# Patient Record
Sex: Male | Born: 1975
Health system: Southern US, Community
[De-identification: ages and names within clinical notes are randomized; demographics above are authoritative.]

## PROBLEM LIST (undated history)

## (undated) DIAGNOSIS — I10 Essential (primary) hypertension: Secondary | ICD-10-CM

## (undated) HISTORY — PX: STAPEDECTOMY: SHX2435

## (undated) HISTORY — DX: Essential (primary) hypertension: I10

---

## 2009-10-11 DIAGNOSIS — H526 Other disorders of refraction: Secondary | ICD-10-CM | POA: Insufficient documentation

## 2017-03-14 ENCOUNTER — Ambulatory Visit: Payer: Self-pay | Admitting: Physician Assistant

## 2017-04-08 ENCOUNTER — Ambulatory Visit: Payer: Self-pay | Admitting: Internal Medicine

## 2017-10-21 ENCOUNTER — Encounter (INDEPENDENT_AMBULATORY_CARE_PROVIDER_SITE_OTHER): Payer: Self-pay

## 2017-10-28 ENCOUNTER — Encounter: Payer: Self-pay | Admitting: Family Medicine

## 2017-10-28 ENCOUNTER — Ambulatory Visit: Payer: BLUE CROSS/BLUE SHIELD | Admitting: Family Medicine

## 2017-10-28 VITALS — BP 130/98 | HR 69 | Temp 98.3°F | Ht 62.5 in | Wt 180.0 lb

## 2017-10-28 DIAGNOSIS — I10 Essential (primary) hypertension: Secondary | ICD-10-CM

## 2017-10-28 LAB — BASIC METABOLIC PANEL
BUN: 11 mg/dL (ref 6–23)
CHLORIDE: 101 meq/L (ref 96–112)
CO2: 30 mEq/L (ref 19–32)
Calcium: 10 mg/dL (ref 8.4–10.5)
Creatinine, Ser: 0.99 mg/dL (ref 0.40–1.50)
GFR: 88.4 mL/min (ref >60.00)
Glucose, Bld: 106 mg/dL — ABNORMAL HIGH (ref 70–99)
Potassium: 3.6 mEq/L (ref 3.5–5.1)
SODIUM: 138 meq/L (ref 135–145)

## 2017-10-28 LAB — HEPATIC FUNCTION PANEL
ALT: 55 U/L — AB (ref 0–53)
AST: 25 U/L (ref 0–37)
Albumin: 4.8 g/dL (ref 3.5–5.2)
Alkaline Phosphatase: 53 U/L (ref 39–117)
BILIRUBIN DIRECT: 0.1 mg/dL (ref 0.0–0.3)
BILIRUBIN TOTAL: 0.8 mg/dL (ref 0.2–1.2)
Total Protein: 7.7 g/dL (ref 6.0–8.3)

## 2017-10-28 LAB — LDL CHOLESTEROL, DIRECT: LDL DIRECT: 104 mg/dL

## 2017-10-28 LAB — LIPID PANEL
CHOL/HDL RATIO: 4
CHOLESTEROL: 161 mg/dL (ref 0–200)
HDL: 36.1 mg/dL — ABNORMAL LOW (ref >39.00)
NONHDL: 124.43
Triglycerides: 295 mg/dL — ABNORMAL HIGH (ref 0.0–149.0)
VLDL: 59 mg/dL — AB (ref 0.0–40.0)

## 2017-10-28 MED ORDER — IRBESARTAN 300 MG PO TABS: 300.0000 mg | ORAL_TABLET | Freq: Every day | ORAL | 1 refills | Status: DC

## 2017-10-28 NOTE — Progress Notes (Signed)
   Subjective:    Patient ID: Alan Collier, male    DOB: December 23, 1975, 42 y.o.   MRN: [id number]  HPI   Patient presents to clinic to establish primary care. He recently moved to area from the state of [place] to be near his family.  He has taken avapro 300mg  for BP control for years, has been off it for a few weeks due to not having insurance or PCP in area.    Patient Active Problem List   Diagnosis Date Noted  . Essential hypertension 10/28/2017   Social History   Tobacco Use  . Smoking status: Never Smoker  . Smokeless tobacco: Never Used  Substance Use Topics  . Alcohol use: Not on file   Family History  Problem Relation Age of Onset  . Hypertension Mother   . Hypertension Maternal Grandmother   . Diabetes Maternal Grandmother   . Gastric cancer Maternal Grandmother    History reviewed. No pertinent surgical history.   Review of Systems  Constitutional: Negative for chills, fatigue and fever.  HENT: Negative for congestion, ear pain, sinus pain and sore throat.   Eyes: Negative.   Respiratory: Negative for cough, shortness of breath and wheezing.   Cardiovascular: Negative for chest pain, palpitations and leg swelling.  Gastrointestinal: Negative for abdominal pain, diarrhea, nausea and vomiting.  Genitourinary: Negative for dysuria, frequency and urgency.  Musculoskeletal: Negative for arthralgias and myalgias.  Skin: Negative for color change, pallor and rash.  Neurological: Negative for syncope, light-headedness and headaches.  Psychiatric/Behavioral: The patient is not nervous/anxious.       Objective:   Physical Exam  Constitutional: He is oriented to person, place, and time. He appears well-developed and well-nourished. No distress.  HENT:  Head: Normocephalic and atraumatic.  Neck: Neck supple. No tracheal deviation present.  Cardiovascular: Normal rate, regular rhythm and normal heart sounds.  No murmur heard. No carotid bruit  Pulmonary/Chest:  Effort normal and breath sounds normal. No respiratory distress.  Musculoskeletal: Normal range of motion. He exhibits no edema.  Neurological: He is alert and oriented to person, place, and time.  Skin: Skin is warm and dry. No pallor.  Psychiatric: He has a normal mood and affect. His behavior is normal. Thought content normal.  Nursing note and vitals reviewed.     Vitals:   10/28/17 0953  BP: (!) 130/98  Pulse: 69  Temp: 98.3 F (36.8 C)  SpO2: 99%    Assessment & Plan:    Essential HTN --- Patient will continue avapro 300mg  daily and monitor BP. He will follow good diet and exercise. We will get BMET, Lipid panel and hepatic panel drawn in lab today.  Declines flu vaccine in clinic today  Follow up in 6 months, return to clinic sooner if issues arise.

## 2017-10-29 DIAGNOSIS — H9319 Tinnitus, unspecified ear: Secondary | ICD-10-CM | POA: Diagnosis not present

## 2017-10-29 DIAGNOSIS — H90A32 Mixed conductive and sensorineural hearing loss, unilateral, left ear with restricted hearing on the contralateral side: Secondary | ICD-10-CM | POA: Diagnosis not present

## 2017-10-30 ENCOUNTER — Encounter: Payer: Self-pay | Admitting: *Deleted

## 2017-12-02 ENCOUNTER — Other Ambulatory Visit: Payer: Self-pay | Admitting: Otolaryngology

## 2017-12-02 DIAGNOSIS — H90A32 Mixed conductive and sensorineural hearing loss, unilateral, left ear with restricted hearing on the contralateral side: Secondary | ICD-10-CM

## 2017-12-09 ENCOUNTER — Ambulatory Visit: Payer: BLUE CROSS/BLUE SHIELD

## 2018-05-07 ENCOUNTER — Encounter: Payer: Self-pay | Admitting: Family Medicine

## 2018-05-12 ENCOUNTER — Emergency Department
Admission: EM | Admit: 2018-05-12 | Discharge: 2018-05-13 | Disposition: A | Discharge status: Home / Self Care | Payer: 59 | Attending: Emergency Medicine | Admitting: Emergency Medicine

## 2018-05-12 ENCOUNTER — Other Ambulatory Visit: Payer: Self-pay

## 2018-05-12 ENCOUNTER — Encounter: Payer: Self-pay | Admitting: Emergency Medicine

## 2018-05-12 DIAGNOSIS — H6691 Otitis media, unspecified, right ear: Secondary | ICD-10-CM | POA: Insufficient documentation

## 2018-05-12 DIAGNOSIS — H9201 Otalgia, right ear: Secondary | ICD-10-CM | POA: Diagnosis present

## 2018-05-12 DIAGNOSIS — I10 Essential (primary) hypertension: Secondary | ICD-10-CM | POA: Diagnosis not present

## 2018-05-12 DIAGNOSIS — J302 Other seasonal allergic rhinitis: Secondary | ICD-10-CM | POA: Diagnosis not present

## 2018-05-12 DIAGNOSIS — J0111 Acute recurrent frontal sinusitis: Secondary | ICD-10-CM | POA: Diagnosis not present

## 2018-05-12 DIAGNOSIS — H6690 Otitis media, unspecified, unspecified ear: Secondary | ICD-10-CM

## 2018-05-12 NOTE — ED Triage Notes (Signed)
Pt has right earache for for 1 day.  Pt alert.

## 2018-05-12 NOTE — ED Triage Notes (Signed)
Pt c/o with right ear pain as well as "muffled" sound through chronic hearing aide. Pt denies drainage.

## 2018-05-12 NOTE — ED Notes (Signed)
Patient report sudden onset of pain to right ear. Pain began 8 hours ago and has progressively become worse. Patient wears hearing aids to b/l ears.

## 2018-05-13 MED ORDER — AMOXICILLIN-POT CLAVULANATE 875-125 MG PO TABS: 1.0000 | ORAL_TABLET | Freq: Two times a day (BID) | ORAL | 0 refills | Status: AC

## 2018-05-13 NOTE — ED Provider Notes (Signed)
[facility] Emergency Department Provider Note  ____________________________________________  Time seen: Approximately 12:12 AM  I have reviewed the triage vital signs and the nursing notes.   HISTORY  Chief Complaint Otalgia    HPI Alan Collier is a 43 y.o. male is post bilateral ear surgeries for deafness which he cannot fully describe, wears bilateral hearing aids, presenting with right ear pain.  The patient reports that last week, his son had an otitis media.  At the same time, the patient developed a "dry" throat which caused him to cough and have a dry cough, associated with sinus pressure, and bilateral ear fullness, which is typical for this time of year for him.  In the past, he has had seasonal allergies and feels that the symptoms are typical.  He has been taking Zyrtec, which helps, and was instructed to and start Flonase, which he did not do.  Today, the patient was at work and he began to develop not only fullness and a muffled hearing sensation on the right but also pain.  He did not try anything for his pain.  He continues to be afebrile.  The only symptoms he has that have not resolved are a maxillary sinus pressure sensation and the right ear pain and muffled sound.  No fevers.  He has not had any changes in his hearing aids.  History reviewed. No pertinent past medical history.  Patient Active Problem List   Diagnosis Date Noted  . Essential hypertension 10/28/2017    History reviewed. No pertinent surgical history.  Current Outpatient Rx  . Order #: [id number] Class: Print  . Order #: [id number] Class: Normal    Allergies Iodine and Shellfish allergy  Family History  Problem Relation Age of Onset  . Hypertension Mother   . Hypertension Maternal Grandmother   . Diabetes Maternal Grandmother   . Gastric cancer Maternal Grandmother     Social History Social History   Tobacco Use  . Smoking status: Never Smoker  . Smokeless  tobacco: Never Used  Substance Use Topics  . Alcohol use: Not Currently  . Drug use: Not Currently    Review of Systems Constitutional: No fever/chills.  No lightheadedness or syncope. EARS: Lateral hearing loss wears hearing aids.  Positive bilateral full feeling.  Positive right-sided muffled hearing with associated pain. Eyes: No visual changes.  No eye discharge. ENT: Dry throat, now resolved.  Positive congestion without rhinorrhea.  Positive maxillary sinus pressure. Cardiovascular: Denies chest pain. Denies palpitations. Respiratory: Denies shortness of breath.  Positive cough, now resolved.. Gastrointestinal: No abdominal pain.  No nausea, no vomiting.  No diarrhea.  No constipation. Genitourinary: Negative for dysuria. Musculoskeletal: Negative for back pain. Skin: Negative for rash. Neurological: Negative for headaches. No focal numbness, tingling or weakness.     ____________________________________________   PHYSICAL EXAM:  VITAL SIGNS: ED Triage Vitals  Enc Vitals Group     BP 05/12/18 2344 (!) 152/97     Pulse Rate 05/12/18 2342 95     Resp 05/12/18 2342 20     Temp 05/12/18 2342 98.1 F (36.7 C)     Temp Source 05/12/18 2342 Oral     SpO2 05/12/18 2342 98 %     Weight 05/12/18 2342 185 lb (83.9 kg)     Height 05/12/18 2342 5\' 3"  (1.6 m)     Head Circumference --      Peak Flow --      Pain Score 05/12/18 2342 8     Pain  Loc --      Pain Edu? --      Excl. in [place]? --     Constitutional: Alert and oriented.  Answers questions appropriately. Eyes: Conjunctivae are normal.  EOMI. No scleral icterus.  No discharge. EARS: The left TM has some mild fluid behind it without any significant bulge or erythema.  The canal is clear.  The right TM has some central and right-sided scar tissue.  The lower lunar aspect of the ear is erythematous, and there is also some mild amount of fluid behind the ear without any significant bulge.  The TM is clear. Head:  Atraumatic. Nose: Has a mild congestion without rhinorrhea.  Positive tenderness to palpation over the maxillary sinus. Mouth/Throat: Mucous membranes are moist.  Neck: No stridor.  Supple.  No meningismus. Cardiovascular: Normal rate, regular rhythm. No murmurs, rubs or gallops.  Respiratory: Normal respiratory effort.  No accessory muscle use or retractions. Lungs CTAB.  No wheezes, rales or ronchi. Musculoskeletal: Moves all extremities well. Neurologic:  A&Ox3.  Speech is clear.  Face and smile are symmetric.  EOMI.  Moves all extremities well.  Normal gait. Skin:  Skin is warm, dry and intact. No rash noted. Psychiatric: Mood and affect are normal. Speech and behavior are normal.  Normal judgement  ____________________________________________   LABS (all labs ordered are listed, but only abnormal results are displayed)  Labs Reviewed - No data to display ____________________________________________  EKG  Not indicated ____________________________________________  RADIOLOGY  No results found.  ____________________________________________   PROCEDURES  Procedure(s) performed: None  Procedures  Critical Care performed: No ____________________________________________   INITIAL IMPRESSION / ASSESSMENT AND PLAN / ED COURSE  Pertinent labs & imaging results that were available during my care of the patient were reviewed by me and considered in my medical decision making (see chart for details).  43 y.o. male with bilateral hearing loss status post surgery in both ears presenting with bilateral ear fullness and right ear pain.  This is after symptoms of congestion, maxillary sinus pressure, dry throat and cough, most of which have resolved.  Overall, the patient is hemodynamically stable.  He is afebrile.  His symptoms are most concerning for sinusitis and possibly right otitis media although it is not severe and I have never seen his ears before so I do not know what his  normal anatomy looks like given his prior surgeries.  Although the patient has not been febrile, coronavirus is on the differential given the pandemic.  At this time, I will plan to treat the patient with a 10-day course of antibiotics for sinusitis and right otitis media.  I have encouraged him to avoid healthcare contact unless he feels that he is getting worse; I told him to expect improvement within 5 days and follow-up with his ENT in [place] if he is not feeling any better.  I have also talked to the patient about frequent and thorough handwashing and 6 foot distancing from his wife and son until his symptoms have completely cleared.  I will keep the patient out of work for at least 3 days to see if his symptoms worsen or improve and if they worsen he will not go back to work.  While the most likely etiology of the patient's symptoms is seasonal allergies, I cannot exclude coronavirus and the patient is aware of this.   Alan Collier was evaluated in Emergency Department on 05/13/2018 for the symptoms described in the history of present illness. He was evaluated  in the context of the global COVID-19 pandemic, which necessitated consideration that the patient might be at risk for infection with the SARS-CoV-2 virus that causes COVID-19. Institutional protocols and algorithms that pertain to the evaluation of patients at risk for COVID-19 are in a state of rapid change based on information released by regulatory bodies including the CDC and federal and state organizations. These policies and algorithms were followed during the patient's care in the ED.   ____________________________________________  FINAL CLINICAL IMPRESSION(S) / ED DIAGNOSES  Final diagnoses:  Acute otitis media, unspecified otitis media type  Acute recurrent frontal sinusitis  Seasonal allergies         NEW MEDICATIONS STARTED DURING THIS VISIT:  New Prescriptions   AMOXICILLIN-CLAVULANATE (AUGMENTIN) 875-125 MG TABLET     Take 1 tablet by mouth every 12 (twelve) hours for 10 days.      Eula Listen, MD 05/13/18 [phone]

## 2018-05-13 NOTE — Discharge Instructions (Addendum)
Please take the entire course of antibiotics, even if you are feeling better.  Please make an appointment with your ENT doctor if you are not feeling any better after 5 days of treatment, or if at any point you are feeling worse.  You may take Tylenol for pain.  Your symptoms are not completely typical for corona virus, please continue to take your temperature twice daily and immediately isolate yourself from your family members in your community if you develop a temperature greater than 100.4.  Please remain out of work for at least 3 days to see if your symptoms are improving and to make sure you are not getting a fever.  Return to the emergency department if you develop severe pain, fever, shortness of breath, or any other symptoms concerning to you.

## 2018-05-29 ENCOUNTER — Other Ambulatory Visit: Payer: Self-pay | Admitting: Lab

## 2018-05-29 DIAGNOSIS — I10 Essential (primary) hypertension: Secondary | ICD-10-CM

## 2018-05-29 MED ORDER — IRBESARTAN 300 MG PO TABS: 300.0000 mg | ORAL_TABLET | Freq: Every day | ORAL | 1 refills | Status: DC

## 2018-06-08 ENCOUNTER — Encounter: Payer: Self-pay | Admitting: Family Medicine

## 2018-06-09 ENCOUNTER — Ambulatory Visit (INDEPENDENT_AMBULATORY_CARE_PROVIDER_SITE_OTHER): Payer: 59 | Admitting: Family Medicine

## 2018-06-09 ENCOUNTER — Telehealth: Payer: Self-pay | Admitting: Family Medicine

## 2018-06-09 ENCOUNTER — Other Ambulatory Visit: Payer: Self-pay

## 2018-06-09 DIAGNOSIS — E781 Pure hyperglyceridemia: Secondary | ICD-10-CM | POA: Diagnosis not present

## 2018-06-09 DIAGNOSIS — I10 Essential (primary) hypertension: Secondary | ICD-10-CM

## 2018-06-09 DIAGNOSIS — R079 Chest pain, unspecified: Secondary | ICD-10-CM | POA: Diagnosis not present

## 2018-06-09 NOTE — Telephone Encounter (Signed)
Please set up virtual visit for pateint

## 2018-06-09 NOTE — Progress Notes (Addendum)
Patient ID: Alan Collier, male   DOB: Dec 25, 1975, 43 y.o.   MRN: [id number]  Virtual Visit via video Note  This visit type was conducted due to national recommendations for restrictions regarding the COVID-19 pandemic (e.g. social distancing).  This format is felt to be most appropriate for this patient at this time.  All issues noted in this document were discussed and addressed.  No physical exam was performed (except for noted visual exam findings with Video Visits).   I connected with [place] today at  3:20 PM EDT by a video enabled telemedicine application or telephone and verified that I am speaking with the correct person using two identifiers. Location patient: home Location provider:[facility] Persons participating in the virtual visit: patient, provider  I discussed the limitations, risks, security and privacy concerns of performing an evaluation and management service by video and the availability of in person appointments. I also discussed with the patient that there may be a patient responsible charge related to this service. The patient expressed understanding and agreed to proceed.   HPI:  Patient and I connected via video due to complaints of right-sided chest pain off and on for the past week or so.  Patient is unsure if he could have possibly pulled a muscle or if he is having indigestion.  He did do a lot of yard work over the weekend, so it is possible it is a pulled muscle.  States the pain on the right side will come and go and feels like a dull aching on the right lower rib cage.  Denies feelings of heartburn or acid reflux.  Denies feeling sweaty or dizzy.  Denies palpitations.  Denies pain up into her jaw or inner arms.  Patient is planning on trying taking an antacid medicine to see if this makes any difference; he had read online that his pain could be from indigestion so he was planning to do this prior to calling today.  Has not yet tried antacid  medicine.  Patient is concerned about the pain due to his history of hypertension and knowing that he has higher triglycerides as shown in his blood work from September 2019.  Patient has not checked his blood pressure in a while.  Denies fever or chills.  Denies nausea, vomiting or diarrhea.  Denies GU issues.  Denies body aches.  ROS: See pertinent positives and negatives per HPI.   Patient Active Problem List   Diagnosis Date Noted  . Hypertension   . Essential hypertension 10/28/2017    Family History  Problem Relation Age of Onset  . Hypertension Mother   . Hypertension Maternal Grandmother   . Diabetes Maternal Grandmother   . Gastric cancer Maternal Grandmother     Current Outpatient Medications:  .  irbesartan (AVAPRO) 300 MG tablet, Take 1 tablet (300 mg total) by mouth daily., Disp: 90 tablet, Rfl: 1  EXAM:  GENERAL: alert, oriented, appears well and in no acute distress  HEENT: atraumatic, conjunttiva clear, no obvious abnormalities on inspection of external nose and ears  NECK: normal movements of the head and neck  LUNGS: on inspection no signs of respiratory distress, breathing rate appears normal, no obvious gross SOB, gasping or wheezing  CV: no obvious cyanosis  MS: moves all visible extremities without noticeable abnormality  PSYCH/NEURO: pleasant and cooperative, no obvious depression or anxiety, speech and thought processing grossly intact  ASSESSMENT AND PLAN:  Discussed the following assessment and plan:  Right-sided chest pain  Essential hypertension  High triglycerides  While speaking over the video chat patient appears to be in no acute distress.  Patient advised that I am unable to properly and fully assess the right-sided chest pain without having him come in for blood work, EKG and chest x-ray.  Patient is going into work at this time and cannot come into office for any of these tests.  States he will call office tomorrow and let us know  if he would like to come in for the lab testing, EKG and x-ray.    Encourage patient to really strongly consider coming in for this testing as I cannot say for sure whether or not the chest pain is cardiac related or related to some other issue without doing more testing.  I discussed the assessment and treatment plan with the patient. The patient was provided an opportunity to ask questions and all were answered. The patient agreed with the plan and demonstrated an understanding of the instructions.   The patient was advised to call back or seek an in-person evaluation if the symptoms worsen or if the condition fails to improve as anticipated.  Jodelle Green, FNP

## 2018-06-09 NOTE — Telephone Encounter (Signed)
Patient will call us tomorrow to let us know if he can come in sometime this week to get EKG, labs, CXR  Also needs to speak with someone regarding new insurance  He is at work now, but said he will call tomorrow

## 2018-06-09 NOTE — Telephone Encounter (Signed)
Called Pt he has at [facility] visit today with NP Lauren at 3:30pm

## 2018-06-09 NOTE — Telephone Encounter (Signed)
Was virtual set up?  This message was routed back to me with no information in it

## 2018-06-10 ENCOUNTER — Encounter: Payer: Self-pay | Admitting: Family Medicine

## 2018-06-10 ENCOUNTER — Telehealth: Payer: Self-pay

## 2018-06-10 DIAGNOSIS — I10 Essential (primary) hypertension: Secondary | ICD-10-CM | POA: Insufficient documentation

## 2018-06-10 DIAGNOSIS — E781 Pure hyperglyceridemia: Secondary | ICD-10-CM | POA: Insufficient documentation

## 2018-06-10 NOTE — Telephone Encounter (Signed)
Please follow up with patient about if he wants to come in this week for labs, ekg and CXR  Also he has new insurance and this needs to be updated

## 2018-06-10 NOTE — Telephone Encounter (Signed)
Copied from [place] [id number]. Topic: Appointment Scheduling - Scheduling Inquiry for Clinic >> Jun 10, 2018 11:39 AM Alanda Slim E wrote: Reason for CRM: Pt called to schedule an appt to have a chest xray, EKG and labs / follow up to his 4.27.20 virtual appt.  Pt also wants to provide his updated insurance information

## 2018-06-10 NOTE — Telephone Encounter (Signed)
Insurance verification needed please  Thanks!  LG

## 2018-06-11 ENCOUNTER — Encounter: Payer: Self-pay | Admitting: Family Medicine

## 2018-06-11 NOTE — Telephone Encounter (Signed)
Called Pt No answer, Left a VM for the Pt to call the office with his current insurance information.

## 2018-06-13 NOTE — Telephone Encounter (Signed)
Copied from [place] [phone]. Topic: General - Inquiry >> Jun 12, 2018  4:06 PM Richardo Priest, NT wrote: Reason for CRM: Patient called in in regards to the call back he got for his appointment on 5/4. Patient is confused because he has already had a virtual visit and Lauren Guse had told him it would be fine to come into the office for his EKG, X-ray, and labs. Call back is [phone].  Called and spoke with the pt and he has a appt scheduled for Monday for his xray, EKG and labs per L. Guse.  Nina,cma

## 2018-06-16 ENCOUNTER — Ambulatory Visit (INDEPENDENT_AMBULATORY_CARE_PROVIDER_SITE_OTHER): Payer: 59

## 2018-06-16 ENCOUNTER — Other Ambulatory Visit: Payer: Self-pay

## 2018-06-16 ENCOUNTER — Encounter: Payer: Self-pay | Admitting: Family Medicine

## 2018-06-16 ENCOUNTER — Ambulatory Visit (INDEPENDENT_AMBULATORY_CARE_PROVIDER_SITE_OTHER): Payer: 59 | Admitting: Family Medicine

## 2018-06-16 VITALS — BP 130/78 | HR 78 | Temp 98.1°F | Resp 16 | Ht 63.0 in | Wt 177.2 lb

## 2018-06-16 DIAGNOSIS — I10 Essential (primary) hypertension: Secondary | ICD-10-CM

## 2018-06-16 DIAGNOSIS — E781 Pure hyperglyceridemia: Secondary | ICD-10-CM | POA: Diagnosis not present

## 2018-06-16 DIAGNOSIS — R079 Chest pain, unspecified: Secondary | ICD-10-CM | POA: Diagnosis not present

## 2018-06-16 LAB — COMPREHENSIVE METABOLIC PANEL
ALT: 46 U/L (ref 0–53)
AST: 18 U/L (ref 0–37)
Albumin: 4.6 g/dL (ref 3.5–5.2)
Alkaline Phosphatase: 55 U/L (ref 39–117)
BUN: 13 mg/dL (ref 6–23)
CO2: 28 mEq/L (ref 19–32)
Calcium: 9.4 mg/dL (ref 8.4–10.5)
Chloride: 104 mEq/L (ref 96–112)
Creatinine, Ser: 0.98 mg/dL (ref 0.40–1.50)
GFR: 83.9 mL/min (ref >60.00)
Glucose, Bld: 105 mg/dL — ABNORMAL HIGH (ref 70–99)
Potassium: 4.6 mEq/L (ref 3.5–5.1)
Sodium: 139 mEq/L (ref 135–145)
Total Bilirubin: 0.5 mg/dL (ref 0.2–1.2)
Total Protein: 7 g/dL (ref 6.0–8.3)

## 2018-06-16 LAB — LIPID PANEL
Cholesterol: 143 mg/dL (ref 0–200)
HDL: 31.9 mg/dL — ABNORMAL LOW (ref >39.00)
NonHDL: 111.33
Total CHOL/HDL Ratio: 4
Triglycerides: 209 mg/dL — ABNORMAL HIGH (ref 0.0–149.0)
VLDL: 41.8 mg/dL — ABNORMAL HIGH (ref 0.0–40.0)

## 2018-06-16 LAB — CBC WITH DIFFERENTIAL/PLATELET
Basophils Absolute: 0 10*3/uL (ref 0.0–0.1)
Basophils Relative: 0.7 % (ref 0.0–3.0)
Eosinophils Absolute: 0.1 10*3/uL (ref 0.0–0.7)
Eosinophils Relative: 2.2 % (ref 0.0–5.0)
HCT: 42.8 % (ref 39.0–52.0)
Hemoglobin: 15 g/dL (ref 13.0–17.0)
Lymphocytes Relative: 49.1 % — ABNORMAL HIGH (ref 12.0–46.0)
Lymphs Abs: 2.8 10*3/uL (ref 0.7–4.0)
MCHC: 35.2 g/dL (ref 30.0–36.0)
MCV: 86.9 fl (ref 78.0–100.0)
Monocytes Absolute: 0.5 10*3/uL (ref 0.1–1.0)
Monocytes Relative: 8.4 % (ref 3.0–12.0)
Neutro Abs: 2.3 10*3/uL (ref 1.4–7.7)
Neutrophils Relative %: 39.6 % — ABNORMAL LOW (ref 43.0–77.0)
Platelets: 240 10*3/uL (ref 150.0–400.0)
RBC: 4.92 Mil/uL (ref 4.22–5.81)
RDW: 13.1 % (ref 11.5–15.5)
WBC: 5.8 10*3/uL (ref 4.0–10.5)

## 2018-06-16 LAB — CK: Total CK: 180 U/L (ref 7–232)

## 2018-06-16 LAB — TROPONIN I: TNIDX: 0 ug/l (ref 0.00–0.06)

## 2018-06-16 LAB — LDL CHOLESTEROL, DIRECT: Direct LDL: 76 mg/dL

## 2018-06-16 NOTE — Patient Instructions (Signed)
Your ekg looks great, your BP is great today.  Lab work and CXR results, should be back tomorrow  Based on our results so far, I am leaning toward a muscular chest pain rather than cardiac issue.  Continue your BP medication  Nonspecific Chest Pain Chest pain can be caused by many different conditions. Some causes of chest pain can be life-threatening. These will require treatment right away. Serious causes of chest pain include:  Heart attack.  A tear in the body's main blood vessel.  Redness and swelling (inflammation) around your heart.  Blood clot in your lungs. Other causes of chest pain may not be so serious. These include:  Heartburn.  Anxiety or stress.  Damage to bones or muscles in your chest.  Lung infections. Chest pain can feel like:  Pain or discomfort in your chest.  Crushing, pressure, aching, or squeezing pain.  Burning or tingling.  Dull or sharp pain that is worse when you move, cough, or take a deep breath.  Pain or discomfort that is also felt in your back, neck, jaw, shoulder, or arm, or pain that spreads to any of these areas. It is hard to know whether your pain is caused by something that is serious or something that is not so serious. So it is important to see your doctor right away if you have chest pain. Follow these instructions at home: Medicines  Take over-the-counter and prescription medicines only as told by your doctor.  If you were prescribed an antibiotic medicine, take it as told by your doctor. Do not stop taking the antibiotic even if you start to feel better. Lifestyle   Rest as told by your doctor.  Do not use any products that contain nicotine or tobacco, such as cigarettes, e-cigarettes, and chewing tobacco. If you need help quitting, ask your doctor.  Do not drink alcohol.  Make lifestyle changes as told by your doctor. These may include: ? Getting regular exercise. Ask your doctor what activities are safe for you. ?  Eating a heart-healthy diet. A diet and nutrition specialist (dietitian) can help you to learn healthy eating options. ? Staying at a healthy weight. ? Treating diabetes or high blood pressure, if needed. ? Lowering your stress. Activities such as yoga and relaxation techniques can help. General instructions  Pay attention to any changes in your symptoms. Tell your doctor about them or any new symptoms.  Avoid any activities that cause chest pain.  Keep all follow-up visits as told by your doctor. This is important. You may need more testing if your chest pain does not go away. Contact a doctor if:  Your chest pain does not go away.  You feel depressed.  You have a fever. Get help right away if:  Your chest pain is worse.  You have a cough that gets worse, or you cough up blood.  You have very bad (severe) pain in your belly (abdomen).  You pass out (faint).  You have either of these for no clear reason: ? Sudden chest discomfort. ? Sudden discomfort in your arms, back, neck, or jaw.  You have shortness of breath at any time.  You suddenly start to sweat, or your skin gets clammy.  You feel sick to your stomach (nauseous).  You throw up (vomit).  You suddenly feel lightheaded or dizzy.  You feel very weak or tired.  Your heart starts to beat fast, or it feels like it is skipping beats. These symptoms may be an emergency.  Do not wait to see if the symptoms will go away. Get medical help right away. Call your local emergency services (911 in the [place] Do not drive yourself to the hospital. Summary  Chest pain can be caused by many different conditions. The cause may be serious and need treatment right away. If you have chest pain, see your doctor right away.  Follow your doctor's instructions for taking medicines and making lifestyle changes.  Keep all follow-up visits as told by your doctor. This includes visits for any further testing if your chest pain does not  go away.  Be sure to know the signs that show that your condition has become worse. Get help right away if you have these symptoms. This information is not intended to replace advice given to you by your health care provider. Make sure you discuss any questions you have with your health care provider. Document Released: 07/18/2007 Document Revised: 08/01/2017 Document Reviewed: 08/01/2017 Elsevier Interactive Patient Education  2019 [employer].

## 2018-06-16 NOTE — Progress Notes (Signed)
Subjective:    Patient ID: Alan Collier, male    DOB: [birth year], 43 y.o.   MRN: [id number]  HPI   Patient presents to clinic to follow-up on right-sided chest pain.  We had discussed this issue over a doxy video visit, but to further evaluate pain and rule out cardiac issue patient was advised he needed to come in for further testing.  Patient states the right-sided chest pain is been happening off and on for about 3 weeks.  Does not believe he strained himself, but had been doing yard work prior to it occurring and also consider the idea of it being related to heartburn.  Patient has not had this sensation in the past 2 or 3 days.  He also has been monitoring his blood pressure and it has been stable at home in the 130s over 70s and is been also watching his diet eating healthier options, more meats more vegetables and less salt.  Patient Active Problem List   Diagnosis Date Noted  . High triglycerides 06/10/2018  . Hypertension   . Essential hypertension 10/28/2017   Social History   Tobacco Use  . Smoking status: Never Smoker  . Smokeless tobacco: Never Used  Substance Use Topics  . Alcohol use: Not Currently   Review of Systems  Constitutional: Negative for chills, fatigue and fever.  HENT: Negative for congestion, ear pain, sinus pain and sore throat.   Eyes: Negative.   Respiratory: Negative for cough, shortness of breath and wheezing.   Cardiovascular: Negative for palpitations and leg swelling. +episodes of right sided chest pain Gastrointestinal: Negative for abdominal pain, diarrhea, nausea and vomiting.  Genitourinary: Negative for dysuria, frequency and urgency.  Musculoskeletal: Negative for arthralgias and myalgias.  Skin: Negative for color change, pallor and rash.  Neurological: Negative for syncope, light-headedness and headaches.  Psychiatric/Behavioral: The patient is not nervous/anxious.       Objective:   Physical Exam Vitals signs and nursing note  reviewed.  Constitutional:      General: He is not in acute distress.    Appearance: He is not toxic-appearing.  HENT:     Head: Normocephalic and atraumatic.  Eyes:     General: No scleral icterus.    Extraocular Movements: Extraocular movements intact.     Conjunctiva/sclera: Conjunctivae normal.     Pupils: Pupils are equal, round, and reactive to light.  Neck:     Musculoskeletal: Neck supple. No neck rigidity.     Vascular: No carotid bruit.  Cardiovascular:     Rate and Rhythm: Normal rate and regular rhythm.     Heart sounds: Normal heart sounds. No murmur. No friction rub. No gallop.   Pulmonary:     Effort: Pulmonary effort is normal. No respiratory distress.     Breath sounds: Normal breath sounds. No wheezing, rhonchi or rales.  Abdominal:     General: Bowel sounds are normal. There is no distension.     Palpations: Abdomen is soft.     Tenderness: There is no abdominal tenderness. There is no guarding.  Musculoskeletal:     Right lower leg: No edema.     Left lower leg: No edema.  Lymphadenopathy:     Cervical: No cervical adenopathy.  Skin:    General: Skin is warm and dry.     Coloration: Skin is not jaundiced or pale.     Findings: No erythema.  Neurological:     Mental Status: He is alert and oriented to person, place,  and time.     Gait: Gait normal.  Psychiatric:        Mood and Affect: Mood normal.        Behavior: Behavior normal.        Thought Content: Thought content normal.    Vitals:   06/16/18 0927  BP: 130/78  Pulse: 78  Resp: 16  Temp: 98.1 F (36.7 C)  SpO2: 98%      Assessment & Plan:   Right-sided chest pain-patient is EKG performed in clinic and reviewed personally by me, shows normal sinus rhythm and no obvious acute coronary issues.  Patient's vital signs look stable, he appears in no distress.  We will get blood work and chest x-ray to complete our work-up of right-sided chest pain.  As long as the blood work and x-ray come back  unremarkable, I suspect that the pain he is having is most likely chest wall pain.  Essential hypertension-blood pressure looks great on current medications, will continue  High triglycerides -- will recheck lipid in blood work.   Patient will follow-up in approximately 3 months for recheck on chronic medical conditions.  He is aware critical If any issues arise.

## 2018-09-30 ENCOUNTER — Ambulatory Visit: Payer: Self-pay | Admitting: *Deleted

## 2018-09-30 ENCOUNTER — Other Ambulatory Visit: Payer: Self-pay

## 2018-09-30 ENCOUNTER — Ambulatory Visit (INDEPENDENT_AMBULATORY_CARE_PROVIDER_SITE_OTHER): Payer: 59 | Admitting: Family Medicine

## 2018-09-30 DIAGNOSIS — R42 Dizziness and giddiness: Secondary | ICD-10-CM | POA: Diagnosis not present

## 2018-09-30 DIAGNOSIS — I10 Essential (primary) hypertension: Secondary | ICD-10-CM

## 2018-09-30 NOTE — Progress Notes (Signed)
Patient ID: Alan Collier, male   DOB: 12-14-1975, 43 y.o.   MRN: [id number]    Virtual Visit via video Note  This visit type was conducted due to national recommendations for restrictions regarding the COVID-19 pandemic (e.g. social distancing).  This format is felt to be most appropriate for this patient at this time.  All issues noted in this document were discussed and addressed.  No physical exam was performed (except for noted visual exam findings with Video Visits).   I connected with [place] today at  2:00 PM EDT by a video enabled telemedicine application or telephone and verified that I am speaking with the correct person using two identifiers. Location patient: home Location provider: work or home office Persons participating in the virtual visit: patient, provider  I discussed the limitations, risks, security and privacy concerns of performing an evaluation and management service by telephone and the availability of in person appointments. I also discussed with the patient that there may be a patient responsible charge related to this service. The patient expressed understanding and agreed to proceed.  HPI:  Patient and I connected via video to discuss feelings of dizziness and some nausea.  Patient states he noticed a dizziness especially today when bending and crawling underneath the table to pick up his son's toy and upon standing back up he felt dizziness and lightheadedness was wash over him and felt nauseous.  States he sat down in his lazy boy and rested for a few minutes and the dizziness subsided.  Denies chest pain, shortness of breath or wheezing.  Does have episodes of dizziness at times, has had vertigo in the past and states this does feel somewhat similar.  Patient admits he does not drink much water during the day.  Usually drinks coffee and then Coca-Cola zeros throughout the day.  States "I cannot remember the last time he does drink plain water".    Patient  states he does take Zyrtec every day and was also advised to use Flonase nasal spray to help keep seasonal allergies and ear pains under control, but states he has not been using nasal spray and does miss some days of Zyrtec.  Patient was thinking maybe his blood pressure was elevated causing this dizziness, but when he checked it it was in the 120s 130s over 80s and 90s.  He has been on his blood pressure medication for years at the same dose and has had no issues with that.  Denies fever or chills.  Denies any shortness of breath or wheezing.  Denies chest pain.  Denies palpitations.  Lower extremity swelling.  ROS: See pertinent positives and negatives per HPI.  Past Medical History:  Diagnosis Date  . Hypertension    No past surgical history on file.  Family History  Problem Relation Age of Onset  . Hypertension Mother   . Hypertension Maternal Grandmother   . Diabetes Maternal Grandmother   . Gastric cancer Maternal Grandmother    Social History   Tobacco Use  . Smoking status: Never Smoker  . Smokeless tobacco: Never Used  Substance Use Topics  . Alcohol use: Not Currently    Current Outpatient Medications:  .  irbesartan (AVAPRO) 300 MG tablet, Take 1 tablet (300 mg total) by mouth daily., Disp: 90 tablet, Rfl: 1  EXAM:  GENERAL: alert, oriented, appears well and in no acute distress  HEENT: atraumatic, conjunttiva clear, no obvious abnormalities on inspection of external nose and ears  NECK: normal movements of  the head and neck  LUNGS: on inspection no signs of respiratory distress, breathing rate appears normal, no obvious gross SOB, gasping or wheezing  CV: no obvious cyanosis  MS: moves all visible extremities without noticeable abnormality  PSYCH/NEURO: pleasant and cooperative, no obvious depression or anxiety, speech and thought processing grossly intact  ASSESSMENT AND PLAN:  Discussed the following assessment and plan:   Long discussion with  patient regards to possibilities of dizziness causes including not drinking enough water, being related to vertigo/inner ear dysfunction electrolyte imbalances, vitamin imbalances.  Advised patient to take Zyrtec every day consistently and use Flonase every day consistently.  Also advised to drink at minimum 64 ounces of water every day.  Offered to send in meclizine as needed to use if needed, he declines and states will try drinking water and taking allergy medication consistently first.  We will also have him come in the lab for blood work to look at electrolytes, vitamins and check for diabetes due to his high blood pressure history.  1. Dizziness  - CBC; Future - Basic Metabolic Panel (BMET); Future - TSH; Future - VITAMIN D 25 Hydroxy (Vit-D Deficiency, Fractures); Future - B12 and Folate Panel; Future - Hemoglobin A1c; Future  2. Essential hypertension  - Basic Metabolic Panel (BMET); Future - Hemoglobin A1c; Future    I discussed the assessment and treatment plan with the patient. The patient was provided an opportunity to ask questions and all were answered. The patient agreed with the plan and demonstrated an understanding of the instructions.   The patient was advised to call back or seek an in-person evaluation if the symptoms worsen or if the condition fails to improve as anticipated.  I provided 25 minutes of video-face-to-face time during this encounter discussing symptoms and plan of care.    Jodelle Green, FNP

## 2018-09-30 NOTE — Telephone Encounter (Signed)
Patient is calling to report dizziness that is causing him to feel faint at times. He has been feeling this for 2 days. BP- [id number]. Patient states symptoms are worse changing positions. Call to office for appointment  Reason for Disposition . [1] MODERATE dizziness (e.g., interferes with normal activities) AND [2] has NOT been evaluated by physician for this  (Exception: dizziness caused by heat exposure, sudden standing, or poor fluid intake)  Answer Assessment - Initial Assessment Questions 1. DESCRIPTION: "Describe your dizziness."     lightheaded 2. LIGHTHEADED: "Do you feel lightheaded?" (e.g., somewhat faint, woozy, weak upon standing)     Feels faint at times 3. VERTIGO: "Do you feel like either you or the room is spinning or tilting?" (i.e. vertigo)     no 4. SEVERITY: "How bad is it?"  "Do you feel like you are going to faint?" "Can you stand and walk?"   - MILD - walking normally   - MODERATE - interferes with normal activities (e.g., work, school)    - SEVERE - unable to stand, requires support to walk, feels like passing out now.      moderate 5. ONSET:  "When did the dizziness begin?"    Last couple days- it is worse today 6. AGGRAVATING FACTORS: "Does anything make it worse?" (e.g., standing, change in head position)     Sitting to standing 7. HEART RATE: "Can you tell me your heart rate?" "How many beats in 15 seconds?"  (Note: not all patients can do this)       84 8. CAUSE: "What do you think is causing the dizziness?"     Not sure 9. RECURRENT SYMPTOM: "Have you had dizziness before?" If so, ask: "When was the last time?" "What happened that time?"     No- this is new- last week had a couple dizzy moments going from up position to down 10. OTHER SYMPTOMS: "Do you have any other symptoms?" (e.g., fever, chest pain, vomiting, diarrhea, bleeding)       Nausea, 97.7 temp 11. PREGNANCY: "Is there any chance you are pregnant?" "When was your last menstrual period?"      n/a  Protocols used: DIZZINESS [facility]

## 2018-10-07 ENCOUNTER — Encounter: Payer: Self-pay | Admitting: Family Medicine

## 2018-10-09 ENCOUNTER — Other Ambulatory Visit: Payer: Self-pay

## 2018-10-09 ENCOUNTER — Other Ambulatory Visit (INDEPENDENT_AMBULATORY_CARE_PROVIDER_SITE_OTHER): Payer: 59

## 2018-10-09 DIAGNOSIS — R42 Dizziness and giddiness: Secondary | ICD-10-CM

## 2018-10-09 DIAGNOSIS — I10 Essential (primary) hypertension: Secondary | ICD-10-CM | POA: Diagnosis not present

## 2018-10-09 LAB — CBC
HCT: 42.9 % (ref 39.0–52.0)
Hemoglobin: 14.8 g/dL (ref 13.0–17.0)
MCHC: 34.4 g/dL (ref 30.0–36.0)
MCV: 89.7 fl (ref 78.0–100.0)
Platelets: 256 10*3/uL (ref 150.0–400.0)
RBC: 4.78 Mil/uL (ref 4.22–5.81)
RDW: 13.1 % (ref 11.5–15.5)
WBC: 6.2 10*3/uL (ref 4.0–10.5)

## 2018-10-09 LAB — BASIC METABOLIC PANEL
BUN: 14 mg/dL (ref 6–23)
CO2: 28 mEq/L (ref 19–32)
Calcium: 9.5 mg/dL (ref 8.4–10.5)
Chloride: 103 mEq/L (ref 96–112)
Creatinine, Ser: 1 mg/dL (ref 0.40–1.50)
GFR: 81.84 mL/min (ref >60.00)
Glucose, Bld: 82 mg/dL (ref 70–99)
Potassium: 4.1 mEq/L (ref 3.5–5.1)
Sodium: 139 mEq/L (ref 135–145)

## 2018-10-09 LAB — B12 AND FOLATE PANEL
Folate: 13 ng/mL (ref >5.9)
Vitamin B-12: 304 pg/mL (ref 211–911)

## 2018-10-09 LAB — VITAMIN D 25 HYDROXY (VIT D DEFICIENCY, FRACTURES): VITD: 44.65 ng/mL (ref 30.00–100.00)

## 2018-10-09 LAB — TSH: TSH: 2.3 u[IU]/mL (ref 0.35–4.50)

## 2018-10-09 LAB — HEMOGLOBIN A1C: Hgb A1c MFr Bld: 4.7 % (ref 4.6–6.5)

## 2018-10-28 ENCOUNTER — Other Ambulatory Visit: Payer: Self-pay | Admitting: Family Medicine

## 2018-10-28 DIAGNOSIS — I10 Essential (primary) hypertension: Secondary | ICD-10-CM

## 2019-05-29 ENCOUNTER — Encounter: Payer: Self-pay | Admitting: Family

## 2019-05-29 ENCOUNTER — Telehealth (INDEPENDENT_AMBULATORY_CARE_PROVIDER_SITE_OTHER): Payer: 59 | Admitting: Family

## 2019-05-29 DIAGNOSIS — I10 Essential (primary) hypertension: Secondary | ICD-10-CM | POA: Diagnosis not present

## 2019-05-29 DIAGNOSIS — H5213 Myopia, bilateral: Secondary | ICD-10-CM | POA: Insufficient documentation

## 2019-05-29 NOTE — Progress Notes (Signed)
Virtual Visit via Video Note  I connected with@  on 05/29/19 at  9:00 AM EDT by a video enabled telemedicine application and verified that I am speaking with the correct person using two identifiers.  Location patient: home Location provider:work  Persons participating in the virtual visit: patient, provider  I discussed the limitations of evaluation and management by telemedicine and the availability of in person appointments. The patient expressed understanding and agreed to proceed.   HPI:  Establish care  COVID positive  05/08/19 First day symptoms 05/05/19 Negative COVID 04/24/19. Feeling better, has gone back to work. Cough, has resolved.  H/o allergies and has 'runny dose' which feels is his regular allergies.  HTN - compliant with medication. At home , usually around 135/85. Using wrist cuff.  Denies exertional chest pain or pressure, numbness or tingling radiating to left arm or jaw, palpitations, dizziness, frequent headaches, changes in vision, or shortness of breath.  Notes salt indiscretion.  Weight has been decreasing over time.  No formal exercise.  Has never been to cardiology.  Snores at night on occasion. No HA, fatigue.   ROS: See pertinent positives and negatives per HPI.  Past Medical History:  Diagnosis Date  . Hypertension     History reviewed. No pertinent surgical history.  Family History  Problem Relation Age of Onset  . Hypertension Mother   . Hypertension Maternal Grandmother   . Diabetes Maternal Grandmother   . Gastric cancer Maternal Grandmother       Current Outpatient Medications:  .  irbesartan (AVAPRO) 300 MG tablet, TAKE 1 TABLET BY MOUTH  DAILY, Disp: 90 tablet, Rfl: 3  EXAM:  VITALS per patient if applicable: BP Readings from Last 3 Encounters:  05/29/19 112/83  06/16/18 130/78  05/13/18 (!) 147/104   Wt Readings from Last 3 Encounters:  05/29/19 172 lb (78 kg)  06/16/18 177 lb 3.2 oz (80.4 kg)  05/12/18 185 lb (83.9  kg)   Body mass index is 30.47 kg/m.   GENERAL: alert, oriented, appears well and in no acute distress  HEENT: atraumatic, conjunttiva clear, no obvious abnormalities on inspection of external nose and ears  NECK: normal movements of the head and neck  LUNGS: on inspection no signs of respiratory distress, breathing rate appears normal, no obvious gross SOB, gasping or wheezing  CV: no obvious cyanosis  MS: moves all visible extremities without noticeable abnormality  PSYCH/NEURO: pleasant and cooperative, no obvious depression or anxiety, speech and thought processing grossly intact  ASSESSMENT AND PLAN:  Discussed the following assessment and plan:  Essential hypertension Problem List Items Addressed This Visit      Cardiovascular and Mediastinum   Hypertension    Stable. Continue regimen for now. Declines sleep study. Discussed BP goal < 120/80 and importance of follow up every 6 months, at least for labs, and in office visit. He verbalized understanding.          -we discussed possible serious and likely etiologies, options for evaluation and workup, limitations of telemedicine visit vs in person visit, treatment, treatment risks and precautions. Pt prefers to treat via telemedicine empirically rather then risking or undertaking an in person visit at this moment. Patient agrees to seek prompt in person care if worsening, new symptoms arise, or if is not improving with treatment.   I discussed the assessment and treatment plan with the patient. The patient was provided an opportunity to ask questions and all were answered. The patient agreed with the plan and demonstrated  an understanding of the instructions.   The patient was advised to call back or seek an in-person evaluation if the symptoms worsen or if the condition fails to improve as anticipated.   [name], FNP

## 2019-05-29 NOTE — Patient Instructions (Signed)
It is imperative that you are seen AT least twice per year for labs and monitoring. Monitor blood pressure at home and me 5-6 reading on separate days. Goal is less than 120/80, based on newest guidelines, however we certainly want to be less than 130/80;  if persistently higher, please make sooner follow up appointment so we can recheck you blood pressure and manage/ adjust medications.  Look forward to seeing you at your physical.

## 2019-05-29 NOTE — Assessment & Plan Note (Addendum)
Stable. Continue regimen for now. Declines sleep study. Discussed BP goal < 120/80 and importance of follow up every 6 months, at least for labs, and in office visit. He verbalized understanding.

## 2019-07-10 ENCOUNTER — Other Ambulatory Visit: Payer: Self-pay

## 2019-07-10 ENCOUNTER — Encounter: Payer: Self-pay | Admitting: Family

## 2019-07-10 ENCOUNTER — Ambulatory Visit (INDEPENDENT_AMBULATORY_CARE_PROVIDER_SITE_OTHER): Payer: 59 | Admitting: Family

## 2019-07-10 VITALS — BP 126/80 | HR 75 | Temp 96.2°F | Ht 63.0 in | Wt 170.6 lb

## 2019-07-10 DIAGNOSIS — K625 Hemorrhage of anus and rectum: Secondary | ICD-10-CM

## 2019-07-10 DIAGNOSIS — Z23 Encounter for immunization: Secondary | ICD-10-CM

## 2019-07-10 DIAGNOSIS — R3911 Hesitancy of micturition: Secondary | ICD-10-CM | POA: Diagnosis not present

## 2019-07-10 DIAGNOSIS — I10 Essential (primary) hypertension: Secondary | ICD-10-CM | POA: Diagnosis not present

## 2019-07-10 DIAGNOSIS — Z0000 Encounter for general adult medical examination without abnormal findings: Secondary | ICD-10-CM

## 2019-07-10 DIAGNOSIS — Z125 Encounter for screening for malignant neoplasm of prostate: Secondary | ICD-10-CM

## 2019-07-10 LAB — CBC WITH DIFFERENTIAL/PLATELET
Basophils Absolute: 0 10*3/uL (ref 0.0–0.1)
Basophils Relative: 0.6 % (ref 0.0–3.0)
Eosinophils Absolute: 0.1 10*3/uL (ref 0.0–0.7)
Eosinophils Relative: 1.5 % (ref 0.0–5.0)
HCT: 46.6 % (ref 39.0–52.0)
Hemoglobin: 15.8 g/dL (ref 13.0–17.0)
Lymphocytes Relative: 45.5 % (ref 12.0–46.0)
Lymphs Abs: 2.5 10*3/uL (ref 0.7–4.0)
MCHC: 34 g/dL (ref 30.0–36.0)
MCV: 87.8 fl (ref 78.0–100.0)
Monocytes Absolute: 0.4 10*3/uL (ref 0.1–1.0)
Monocytes Relative: 6.4 % (ref 3.0–12.0)
Neutro Abs: 2.5 10*3/uL (ref 1.4–7.7)
Neutrophils Relative %: 46 % (ref 43.0–77.0)
Platelets: 256 10*3/uL (ref 150.0–400.0)
RBC: 5.31 Mil/uL (ref 4.22–5.81)
RDW: 15.2 % (ref 11.5–15.5)
WBC: 5.5 10*3/uL (ref 4.0–10.5)

## 2019-07-10 LAB — TSH: TSH: 1.44 u[IU]/mL (ref 0.35–4.50)

## 2019-07-10 LAB — VITAMIN D 25 HYDROXY (VIT D DEFICIENCY, FRACTURES): VITD: 52.38 ng/mL (ref 30.00–100.00)

## 2019-07-10 LAB — COMPREHENSIVE METABOLIC PANEL
ALT: 30 U/L (ref 0–53)
AST: 17 U/L (ref 0–37)
Albumin: 4.7 g/dL (ref 3.5–5.2)
Alkaline Phosphatase: 45 U/L (ref 39–117)
BUN: 14 mg/dL (ref 6–23)
CO2: 30 mEq/L (ref 19–32)
Calcium: 9.8 mg/dL (ref 8.4–10.5)
Chloride: 101 mEq/L (ref 96–112)
Creatinine, Ser: 0.96 mg/dL (ref 0.40–1.50)
GFR: 85.48 mL/min (ref >60.00)
Glucose, Bld: 101 mg/dL — ABNORMAL HIGH (ref 70–99)
Potassium: 4.7 mEq/L (ref 3.5–5.1)
Sodium: 137 mEq/L (ref 135–145)
Total Bilirubin: 0.8 mg/dL (ref 0.2–1.2)
Total Protein: 7.3 g/dL (ref 6.0–8.3)

## 2019-07-10 LAB — LIPID PANEL
Cholesterol: 152 mg/dL (ref 0–200)
HDL: 40.4 mg/dL (ref >39.00)
LDL Cholesterol: 78 mg/dL (ref 0–99)
NonHDL: 111.79
Total CHOL/HDL Ratio: 4
Triglycerides: 167 mg/dL — ABNORMAL HIGH (ref 0.0–149.0)
VLDL: 33.4 mg/dL (ref 0.0–40.0)

## 2019-07-10 LAB — PSA: PSA: 0.72 ng/mL (ref 0.10–4.00)

## 2019-07-10 LAB — HEMOGLOBIN A1C: Hgb A1c MFr Bld: 4.7 % (ref 4.6–6.5)

## 2019-07-10 MED ORDER — HYDROCORTISONE ACETATE 25 MG RE SUPP: 25.0000 mg | Freq: Two times a day (BID) | RECTAL | 0 refills | Status: DC

## 2019-07-10 MED ORDER — AMLODIPINE BESYLATE 2.5 MG PO TABS: 2.5000 mg | ORAL_TABLET | Freq: Every day | ORAL | 3 refills | Status: DC

## 2019-07-10 NOTE — Progress Notes (Signed)
Subjective:    Patient ID: Alan Collier, male    DOB: 08/12/1975, 44 y.o.   MRN: [id number]  CC: Alan Collier is a 44 y.o. male who presents today for physical exam.    HPI: HTN- at home BP machine says average 130/86. Denies exertional chest pain or pressure, numbness or tingling radiating to left arm or jaw, palpitations, dizziness, frequent headaches, changes in vision, or shortness of breath.   No nsaid use.   Complains BRB on occasion when having a BM. Has been happening for years. Last PCP told him to start colace which he is not currently on.   Think has a hemorrhoid as will feel protrusion from rectum would sending down. No constipation, abdominal pain, fever, melana stool, or coffee ground stools.       Colorectal  Cancer Screening: No colon cancer in family.  Prostate Cancer Screening: Has been discussed with patient; decided to screen with PSA. Endorses that urine stream is weaker. NO pain with BM. No abnormal penile discharge, testicular swelling.        Tetanus -          HIV Screening- Candidate for, declines.  Labs: Screening labs today. Exercise: Gets regular exercise with stationary bike, 3 x per week. No cp.  Alcohol use: none Smoking/tobacco use: Nonsmoker.    HISTORY:  Past Medical History:  Diagnosis Date  . Hypertension     History reviewed. No pertinent surgical history. Family History  Problem Relation Age of Onset  . Hypertension Mother   . Hypertension Maternal Grandmother   . Diabetes Maternal Grandmother   . Gastric cancer Maternal Grandmother   . Prostate cancer Neg Hx   . Colon cancer Neg Hx       ALLERGIES: Iodine and Shellfish allergy  Current Outpatient Medications on File Prior to Visit  Medication Sig Dispense Refill  . irbesartan (AVAPRO) 300 MG tablet TAKE 1 TABLET BY MOUTH  DAILY 90 tablet 3   No current facility-administered medications on file prior to visit.    Social History   Tobacco Use  . Smoking status:  Never Smoker  . Smokeless tobacco: Never Used  Substance Use Topics  . Alcohol use: Not Currently  . Drug use: Not Currently    Review of Systems  Constitutional: Negative for chills and fever.  HENT: Negative for congestion.   Respiratory: Negative for cough and shortness of breath.   Cardiovascular: Negative for chest pain, palpitations and leg swelling.  Gastrointestinal: Positive for anal bleeding. Negative for abdominal distention, abdominal pain, blood in stool, constipation, diarrhea, nausea and vomiting.  Genitourinary: Positive for difficulty urinating. Negative for discharge and hematuria.  Musculoskeletal: Negative for myalgias.  Skin: Negative for rash.  Neurological: Negative for headaches.  Hematological: Negative for adenopathy.  Psychiatric/Behavioral: Negative for confusion.      Objective:    BP 126/80   Pulse 75   Temp (!) 96.2 F (35.7 C) (Temporal)   Ht 5\' 3"  (1.6 m)   Wt 170 lb 9.6 oz (77.4 kg)   SpO2 95%   BMI 30.22 kg/m   BP Readings from Last 3 Encounters:  07/10/19 126/80  05/29/19 112/83  06/16/18 130/78   Wt Readings from Last 3 Encounters:  07/10/19 170 lb 9.6 oz (77.4 kg)  05/29/19 172 lb (78 kg)  06/16/18 177 lb 3.2 oz (80.4 kg)    Physical Exam Vitals reviewed.  Constitutional:      Appearance: He is well-developed.  Neck:  Thyroid: No thyroid mass or thyromegaly.  Cardiovascular:     Rate and Rhythm: Regular rhythm.     Heart sounds: Normal heart sounds.  Pulmonary:     Effort: Pulmonary effort is normal. No respiratory distress.     Breath sounds: Normal breath sounds. No wheezing, rhonchi or rales.  Genitourinary:    Rectum: No external hemorrhoid.     Comments: No external hemorrhoids appreciated Lymphadenopathy:     Head:     Right side of head: No submental, submandibular, tonsillar, preauricular, posterior auricular or occipital adenopathy.     Left side of head: No submental, submandibular, tonsillar,  preauricular, posterior auricular or occipital adenopathy.     Cervical: No cervical adenopathy.  Skin:    General: Skin is warm and dry.  Neurological:     Mental Status: He is alert.  Psychiatric:        Speech: Speech normal.        Behavior: Behavior normal.        Assessment & Plan:   Problem List Items Addressed This Visit      Cardiovascular and Mediastinum   Hypertension    Elevated. Start amlodipine. Patient will send readings from home.       Relevant Medications   amLODipine (NORVASC) 2.5 MG tablet     Digestive   Rectal bleeding    Description most consistent with hemorrhoidal bleeding. Pending stool cards. Empiric treatment with anusol, colace, increased fiber and close follow up.       Relevant Medications   hydrocortisone (ANUSOL-HC) 25 MG suppository   Other Relevant Orders   Fecal occult blood, imunochemical     Other   Routine physical examination - Primary    Encouraged continued exercise. Pending psa and referral to urology due to urinary hesistancy, decrease flow.      Relevant Orders   TSH   CBC with Differential/Platelet   Comprehensive metabolic panel   Hemoglobin A1c   Lipid panel   PSA   VITAMIN D 25 Hydroxy (Vit-D Deficiency, Fractures)    Other Visit Diagnoses    Urinary hesitancy       Relevant Orders   Ambulatory referral to Urology       I am having Alan Collier start on hydrocortisone and amLODipine. I am also having him maintain his irbesartan.   Meds ordered this encounter  Medications  . hydrocortisone (ANUSOL-HC) 25 MG suppository    Sig: Place 1 suppository (25 mg total) rectally 2 (two) times daily.    Dispense:  12 suppository    Refill:  0    Order Specific Question:   Supervising Provider    Answer:   Derrel Nip, TERESA L [[id number]]  . amLODipine (NORVASC) 2.5 MG tablet    Sig: Take 1 tablet (2.5 mg total) by mouth daily.    Dispense:  90 tablet    Refill:  3    Order Specific Question:   Supervising Provider     Answer:   Crecencio Mc [[id number]]    Return precautions given.   Risks, benefits, and alternatives of the medications and treatment plan prescribed today were discussed, and patient expressed understanding.   Education regarding symptom management and diagnosis given to patient on AVS.   Continue to follow with Alan Hawthorne, FNP for routine health maintenance.   [facility] and I agreed with plan.   Alan Paris, FNP

## 2019-07-10 NOTE — Patient Instructions (Addendum)
Start colace ;increase fiber  Trial of anu sol to suspected hemorrhoid.  VERY important to return stool cards to ensure NO blood in stool itself.   Start amlodipine for blood pressure  Referral to urology Let us know if you dont hear back within a week in regards to an appointment being scheduled.     It is imperative that you are seen AT least twice per year for labs and monitoring. Monitor blood pressure at home and me 5-6 reading on separate days. Goal is less than 120/80, based on newest guidelines, however we certainly want to be less than 130/80;  if persistently higher, please make sooner follow up appointment so we can recheck you blood pressure and manage/ adjust medications.   Health Maintenance, Male Adopting a healthy lifestyle and getting preventive care are important in promoting health and wellness. Ask your health care provider about:  The right schedule for you to have regular tests and exams.  Things you can do on your own to prevent diseases and keep yourself healthy. What should I know about diet, weight, and exercise? Eat a healthy diet   Eat a diet that includes plenty of vegetables, fruits, low-fat dairy products, and lean protein.  Do not eat a lot of foods that are high in solid fats, added sugars, or sodium. Maintain a healthy weight Body mass index (BMI) is a measurement that can be used to identify possible weight problems. It estimates body fat based on height and weight. Your health care provider can help determine your BMI and help you achieve or maintain a healthy weight. Get regular exercise Get regular exercise. This is one of the most important things you can do for your health. Most adults should:  Exercise for at least 150 minutes each week. The exercise should increase your heart rate and make you sweat (moderate-intensity exercise).  Do strengthening exercises at least twice a week. This is in addition to the moderate-intensity  exercise.  Spend less time sitting. Even light physical activity can be beneficial. Watch cholesterol and blood lipids Have your blood tested for lipids and cholesterol at 43 years of age, then have this test every 5 years. You may need to have your cholesterol levels checked more often if:  Your lipid or cholesterol levels are high.  You are older than 43 years of age.  You are at high risk for heart disease. What should I know about cancer screening? Many types of cancers can be detected early and may often be prevented. Depending on your health history and family history, you may need to have cancer screening at various ages. This may include screening for:  Colorectal cancer.  Prostate cancer.  Skin cancer.  Lung cancer. What should I know about heart disease, diabetes, and high blood pressure? Blood pressure and heart disease  High blood pressure causes heart disease and increases the risk of stroke. This is more likely to develop in people who have high blood pressure readings, are of African descent, or are overweight.  Talk with your health care provider about your target blood pressure readings.  Have your blood pressure checked: ? Every 3-5 years if you are 27-60 years of age. ? Every year if you are 46 years old or older.  If you are between the ages of 94 and 20 and are a current or former smoker, ask your health care provider if you should have a one-time screening for abdominal aortic aneurysm (AAA). Diabetes Have regular diabetes screenings. This  checks your fasting blood sugar level. Have the screening done:  Once every three years after age 4 if you are at a normal weight and have a low risk for diabetes.  More often and at a younger age if you are overweight or have a high risk for diabetes. What should I know about preventing infection? Hepatitis B If you have a higher risk for hepatitis B, you should be screened for this virus. Talk with your health care  provider to find out if you are at risk for hepatitis B infection. Hepatitis C Blood testing is recommended for:  Everyone born from [birth year] through 1964.  Anyone with known risk factors for hepatitis C. Sexually transmitted infections (STIs)  You should be screened each year for STIs, including gonorrhea and chlamydia, if: ? You are sexually active and are younger than 43 years of age. ? You are older than 43 years of age and your health care provider tells you that you are at risk for this type of infection. ? Your sexual activity has changed since you were last screened, and you are at increased risk for chlamydia or gonorrhea. Ask your health care provider if you are at risk.  Ask your health care provider about whether you are at high risk for HIV. Your health care provider may recommend a prescription medicine to help prevent HIV infection. If you choose to take medicine to prevent HIV, you should first get tested for HIV. You should then be tested every 3 months for as long as you are taking the medicine. Follow these instructions at home: Lifestyle  Do not use any products that contain nicotine or tobacco, such as cigarettes, e-cigarettes, and chewing tobacco. If you need help quitting, ask your health care provider.  Do not use street drugs.  Do not share needles.  Ask your health care provider for help if you need support or information about quitting drugs. Alcohol use  Do not drink alcohol if your health care provider tells you not to drink.  If you drink alcohol: ? Limit how much you have to 0-2 drinks a day. ? Be aware of how much alcohol is in your drink. In the [place] one drink equals one 12 oz bottle of beer (355 mL), one 5 oz glass of wine (148 mL), or one 1 oz glass of hard liquor (44 mL). General instructions  Schedule regular health, dental, and eye exams.  Stay current with your vaccines.  Tell your health care provider if: ? You often feel depressed. ? You  have ever been abused or do not feel safe at home. Summary  Adopting a healthy lifestyle and getting preventive care are important in promoting health and wellness.  Follow your health care provider's instructions about healthy diet, exercising, and getting tested or screened for diseases.  Follow your health care provider's instructions on monitoring your cholesterol and blood pressure. This information is not intended to replace advice given to you by your health care provider. Make sure you discuss any questions you have with your health care provider. Document Revised: 01/22/2018 Document Reviewed: 01/22/2018 Elsevier Patient Education  2020 [employer].

## 2019-07-10 NOTE — Assessment & Plan Note (Signed)
Elevated. Start amlodipine. Patient will send readings from home.

## 2019-07-10 NOTE — Assessment & Plan Note (Addendum)
Encouraged continued exercise. Pending psa and referral to urology due to urinary hesistancy, decrease flow.

## 2019-07-10 NOTE — Addendum Note (Signed)
Addended by: Tressie Ellis E on: 07/10/2019 11:15 AM   Modules accepted: Orders

## 2019-07-10 NOTE — Assessment & Plan Note (Signed)
Description most consistent with hemorrhoidal bleeding. Pending stool cards. Empiric treatment with anusol, colace, increased fiber and close follow up.

## 2019-07-31 ENCOUNTER — Other Ambulatory Visit (INDEPENDENT_AMBULATORY_CARE_PROVIDER_SITE_OTHER): Payer: 59

## 2019-07-31 DIAGNOSIS — K625 Hemorrhage of anus and rectum: Secondary | ICD-10-CM

## 2019-07-31 LAB — FECAL OCCULT BLOOD, IMMUNOCHEMICAL: Fecal Occult Bld: NEGATIVE

## 2019-08-05 ENCOUNTER — Ambulatory Visit: Payer: 59 | Admitting: Urology

## 2019-08-12 ENCOUNTER — Ambulatory Visit (INDEPENDENT_AMBULATORY_CARE_PROVIDER_SITE_OTHER): Payer: 59 | Admitting: Urology

## 2019-08-12 ENCOUNTER — Encounter: Payer: Self-pay | Admitting: Urology

## 2019-08-12 ENCOUNTER — Ambulatory Visit: Payer: 59 | Admitting: Urology

## 2019-08-12 ENCOUNTER — Other Ambulatory Visit: Payer: Self-pay

## 2019-08-12 VITALS — BP 132/83 | HR 74 | Ht 63.0 in | Wt 176.0 lb

## 2019-08-12 DIAGNOSIS — R3911 Hesitancy of micturition: Secondary | ICD-10-CM

## 2019-08-12 DIAGNOSIS — N320 Bladder-neck obstruction: Secondary | ICD-10-CM | POA: Diagnosis not present

## 2019-08-12 NOTE — Progress Notes (Signed)
   08/12/19 1:41 PM   [name] [birth year] [id number]  Referring provider: Burnard Hawthorne, FNP [address] [place],  [place] [address] Chief Complaint  Patient presents with  . Other    HPI: Alan Collier is a 44 y.o. male seen at the request of [name], FNP for evaluation of lower urinary tract symptoms  -Complains of occasional urinary hesitancy, decreased urinary stream,  -IPSS 5/35 -Denies dysuria, gross hematuria or UTI -Denies flank, abdominal, or pelvic pain -Occasional scrotal pain    PMH: Past Medical History:  Diagnosis Date  . Hypertension     Surgical History: History reviewed. No pertinent surgical history.  Home Medications:  Allergies as of 08/12/2019      Reactions   Iodine    Hives and itching    Shellfish Allergy    Hives and itching       Medication List       Accurate as of August 12, 2019  1:41 PM. If you have any questions, ask your nurse or doctor.        amLODipine 2.5 MG tablet Commonly known as: NORVASC Take 1 tablet (2.5 mg total) by mouth daily.   hydrocortisone 25 MG suppository Commonly known as: ANUSOL-HC Place 1 suppository (25 mg total) rectally 2 (two) times daily.   irbesartan 300 MG tablet Commonly known as: AVAPRO TAKE 1 TABLET BY MOUTH  DAILY       Allergies:  Allergies  Allergen Reactions  . Iodine     Hives and itching   . Shellfish Allergy     Hives and itching     Family History: Family History  Problem Relation Age of Onset  . Hypertension Mother   . Hypertension Maternal Grandmother   . Diabetes Maternal Grandmother   . Gastric cancer Maternal Grandmother   . Prostate cancer Neg Hx   . Colon cancer Neg Hx     Social History:  reports that he has never smoked. He has never used smokeless tobacco. He reports previous alcohol use. He reports previous drug use.   Physical Exam: BP 132/83   Pulse 74   Ht 5\' 3"  (1.6 m)   Wt 176 lb (79.8 kg)   BMI 31.18 kg/m     Constitutional:  Alert and oriented, No acute distress. HEENT: [place] AT, moist mucus membranes.  Trachea midline, no masses. Cardiovascular: No clubbing, cyanosis, or edema. Respiratory: Normal respiratory effort, no increased work of breathing. GI: Abdomen is soft, nontender, nondistended, no abdominal masses GU: Phallus without lesions. Urethral meatus is normal in appearance. Testes descended bilaterally without masses or tenderness. Lymph: No cervical or inguinal lymphadenopathy. Skin: No rashes, bruises or suspicious lesions. Neurologic: Grossly intact, no focal deficits, moving all 4 extremities. Psychiatric: Normal mood and affect.  Laboratory Data:  Lab Results  Component Value Date   CREATININE 0.96 07/10/2019    Lab Results  Component Value Date   PSA 0.72 07/10/2019    Assessment & Plan:    1. Lower Urinary Tract Symptoms -Mild voiding symptoms most likely secondary to outlet obstruction -Symtoms are currently not bothersome although he desires management -Follow up PRN  [facility] [address], [place], [place] [address] [phone]  I, Joneen Boers Peace, am acting as a Education administrator for Dr. Nicki Reaper C. Delaynie Stetzer.  I have reviewed the above documentation for accuracy and completeness, and I agree with the above.   Abbie Sons, MD

## 2019-08-13 LAB — URINALYSIS, COMPLETE
Bilirubin, UA: NEGATIVE
Glucose, UA: NEGATIVE
Ketones, UA: NEGATIVE
Leukocytes,UA: NEGATIVE
Nitrite, UA: NEGATIVE
Protein,UA: NEGATIVE
RBC, UA: NEGATIVE
Specific Gravity, UA: 1.01 (ref 1.005–1.030)
Urobilinogen, Ur: 0.2 mg/dL (ref 0.2–1.0)
pH, UA: 6 (ref 5.0–7.5)

## 2019-08-13 LAB — MICROSCOPIC EXAMINATION
Bacteria, UA: NONE SEEN
RBC, Urine: NONE SEEN /hpf (ref 0–2)

## 2019-09-14 ENCOUNTER — Other Ambulatory Visit: Payer: Self-pay

## 2019-09-14 DIAGNOSIS — I10 Essential (primary) hypertension: Secondary | ICD-10-CM

## 2019-09-14 NOTE — Telephone Encounter (Signed)
Patients pharmacy sent a request for Avapro. Last filled on 10/28/18 by Philis Nettle for a year supply.

## 2019-09-15 MED ORDER — IRBESARTAN 300 MG PO TABS: 300.0000 mg | ORAL_TABLET | Freq: Every day | ORAL | 1 refills | Status: DC

## 2019-10-08 ENCOUNTER — Other Ambulatory Visit: Payer: Self-pay

## 2019-10-08 ENCOUNTER — Encounter: Payer: Self-pay | Admitting: Cardiology

## 2019-10-08 ENCOUNTER — Ambulatory Visit (INDEPENDENT_AMBULATORY_CARE_PROVIDER_SITE_OTHER): Payer: 59 | Admitting: Cardiology

## 2019-10-08 VITALS — BP 150/100 | HR 76 | Ht 62.5 in | Wt 170.2 lb

## 2019-10-08 DIAGNOSIS — R079 Chest pain, unspecified: Secondary | ICD-10-CM | POA: Diagnosis not present

## 2019-10-08 DIAGNOSIS — I10 Essential (primary) hypertension: Secondary | ICD-10-CM | POA: Diagnosis not present

## 2019-10-08 DIAGNOSIS — R072 Precordial pain: Secondary | ICD-10-CM | POA: Diagnosis not present

## 2019-10-08 MED ORDER — AMLODIPINE BESYLATE 2.5 MG PO TABS: 2.5000 mg | ORAL_TABLET | Freq: Every day | ORAL | 5 refills | Status: DC

## 2019-10-08 MED ORDER — OMEPRAZOLE MAGNESIUM 20 MG PO TBEC
20.0000 mg | DELAYED_RELEASE_TABLET | Freq: Every day | ORAL | 0 refills | Status: DC
Start: 2019-10-08 — End: 2019-11-23

## 2019-10-08 MED ORDER — METOPROLOL TARTRATE 100 MG PO TABS
100.0000 mg | ORAL_TABLET | Freq: Once | ORAL | 0 refills | Status: DC
Start: 2019-10-08 — End: 2019-11-23

## 2019-10-08 NOTE — Progress Notes (Signed)
Cardiology Office Note:    Date:  10/08/2019   ID:  Alan Collier, DOB 22-Aug-1975, MRN [id number]  PCP:  Burnard Hawthorne, [facility] Cardiologist:  Kate Sable, MD  [place] Electrophysiologist:  None   Referring MD: Burnard Hawthorne, FNP   Chief Complaint  Patient presents with  . New Patient (Initial Visit)    Patient c/o intermittent dull chest pain; Meds verbally reviewed with patient.   Nashton Collier is a 44 y.o. male who is being seen today for the evaluation of chest pain at the request of Burnard Hawthorne, FNP.   History of Present Illness:    Alan Collier is a 44 y.o. male with a hx of hypertension who presents due to chest pain.  Patient states symptoms of chest discomfort began 6 days ago.  He describes chest discomfort as pressure-like, around 3 out of 10 in severity, lasting a couple of minutes.  Symptoms are not related with exertion.  Denies shortness of breath with symptoms.  He has also noticed some chest discomfort when he eats spicy foods, or pizza.  This discomfort from eating food appears to be slightly different from his on and off chest pressure.  Denies smoking, denies any history of heart disease.  Hypertension runs in his family.  Ran out of his blood pressure medication/amlodipine 2.5 mg 3 weeks ago.  Has not taken his BP medicine yet today.  Past Medical History:  Diagnosis Date  . Hypertension     Past Surgical History:  Procedure Laterality Date  . STAPEDECTOMY Bilateral     Current Medications: Current Meds  Medication Sig  . irbesartan (AVAPRO) 300 MG tablet Take 1 tablet (300 mg total) by mouth daily.     Allergies:   Iodine and Shellfish allergy   Social History   Socioeconomic History  . Marital status: Married    Spouse name: Not on file  . Number of children: Not on file  . Years of education: Not on file  . Highest education level: Not on file  Occupational History  . Not on file  Tobacco Use  .  Smoking status: Never Smoker  . Smokeless tobacco: Never Used  Vaping Use  . Vaping Use: Never used  Substance and Sexual Activity  . Alcohol use: Yes    Comment: 1-3 beers per week  . Drug use: Never  . Sexual activity: Not on file  Other Topics Concern  . Not on file  Social History Narrative   Wife is patient   25 year old son.    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   Social Determinants of Health   Financial Resource Strain:   . Difficulty of Paying Living Expenses: Not on file  Food Insecurity:   . Worried About Charity fundraiser in the Last Year: Not on file  . Ran Out of Food in the Last Year: Not on file  Transportation Needs:   . Lack of Transportation (Medical): Not on file  . Lack of Transportation (Non-Medical): Not on file  Physical Activity:   . Days of Exercise per Week: Not on file  . Minutes of Exercise per Session: Not on file  Stress:   . Feeling of Stress : Not on file  Social Connections:   . Frequency of Communication with Friends and Family: Not on file  . Frequency of Social Gatherings with Friends and Family: Not on file  .  Attends Religious Services: Not on file  . Active Member of Clubs or Organizations: Not on file  . Attends Archivist Meetings: Not on file  . Marital Status: Not on file     Family History: The patient's family history includes Diabetes in his maternal grandmother; Gastric cancer in his maternal grandmother; Hypertension in his maternal grandmother and mother. There is no history of Prostate cancer or Colon cancer.  ROS:   Please see the history of present illness.     All other systems reviewed and are negative.  EKGs/Labs/Other Studies Reviewed:    The following studies were reviewed today:   EKG:  EKG is  ordered today.  The ekg ordered today demonstrates normal sinus rhythm, normal ECG.  Recent Labs: 07/10/2019: ALT 30; BUN 14; Creatinine, Ser 0.96; Hemoglobin  15.8; Platelets 256.0; Potassium 4.7; Sodium 137; TSH 1.44  Recent Lipid Panel    Component Value Date/Time   CHOL 152 07/10/2019 1121   TRIG 167.0 (H) 07/10/2019 1121   HDL 40.40 07/10/2019 1121   CHOLHDL 4 07/10/2019 1121   VLDL 33.4 07/10/2019 1121   LDLCALC 78 07/10/2019 1121   LDLDIRECT 76.0 06/16/2018 0932    Physical Exam:    VS:  BP (!) 150/100 (BP Location: Right Arm, Patient Position: Sitting, Cuff Size: Normal)   Pulse 76   Ht 5' 2.5" (1.588 m)   Wt 170 lb 4 oz (77.2 kg)   SpO2 98%   BMI 30.64 kg/m     Wt Readings from Last 3 Encounters:  10/08/19 170 lb 4 oz (77.2 kg)  08/12/19 176 lb (79.8 kg)  07/10/19 170 lb 9.6 oz (77.4 kg)     GEN:  Well nourished, well developed in no acute distress HEENT: Normal NECK: No JVD; No carotid bruits LYMPHATICS: No lymphadenopathy CARDIAC: RRR, no murmurs, rubs, gallops RESPIRATORY:  Clear to auscultation without rales, wheezing or rhonchi  ABDOMEN: Soft, non-tender, non-distended MUSCULOSKELETAL:  No edema; No deformity  SKIN: Warm and dry NEUROLOGIC:  Alert and oriented x 3 PSYCHIATRIC:  Normal affect   ASSESSMENT:    1. Chest pain of uncertain etiology   2. Essential hypertension   3. Precordial pain    PLAN:    In order of problems listed above:  1. Patient with history of chest pain.  Symptoms appear atypical, he has risk factors of hypertension.  He is low to intermediate cardiac risk.  Hence coronary CTA is ideal for patient.  Will evaluate for presence of CAD with coronary CTA.  Get echocardiogram to evaluate any dysfunction. 2. History of hypertension, BP elevated today.  Likely due to running out of amlodipine.  Refilled amlodipine 2.5 mg daily.  Continue ARB as prescribed.  Patient counseled on medication compliance. 3. He also has some discomfort attributed to reflux.  OTC Prilosec 20 mg, 1 tab per day advised.  Follow-up after echo and coronary CTA.   This note was generated in part or whole with  voice recognition software. Voice recognition is usually quite accurate but there are transcription errors that can and very often do occur. I apologize for any typographical errors that were not detected and corrected.  Medication Adjustments/Labs and Tests Ordered: Current medicines are reviewed at length with the patient today.  Concerns regarding medicines are outlined above.  Orders Placed This Encounter  Procedures  . CT CORONARY MORPH W/CTA COR W/SCORE W/CA W/CM &/OR WO/CM  . CT CORONARY FRACTIONAL FLOW RESERVE DATA PREP  . CT CORONARY FRACTIONAL  FLOW RESERVE FLUID ANALYSIS  . EKG 12-Lead  . ECHOCARDIOGRAM COMPLETE   Meds ordered this encounter  Medications  . omeprazole (PRILOSEC OTC) 20 MG tablet    Sig: Take 1 tablet (20 mg total) by mouth daily.    Dispense:  90 tablet    Refill:  0  . amLODipine (NORVASC) 2.5 MG tablet    Sig: Take 1 tablet (2.5 mg total) by mouth daily.    Dispense:  30 tablet    Refill:  5  . metoprolol tartrate (LOPRESSOR) 100 MG tablet    Sig: Take 1 tablet (100 mg total) by mouth once for 1 dose. Take 2 hours prior to your CT scan.    Dispense:  1 tablet    Refill:  0    Patient Instructions  Medication Instructions:   Your physician has recommended you make the following change in your medication:   1.  START taking omeprazole (PRILOSEC OTC) 20 MG tablet: Take 1 tablet (2.5 mg total) by mouth daily. 2.  SEE CT pre-procedure instructions below.  *If you need a refill on your cardiac medications before your next appointment, please call your pharmacy*   Lab Work: If you have labs (blood work) drawn today and your tests are completely normal, you will receive your results only by: Marland Kitchen MyChart Message (if you have MyChart) OR . A paper copy in the mail If you have any lab test that is abnormal or we need to change your treatment, we will call you to review the results.   Testing/Procedures:  1.  Your physician has requested that you have an  echocardiogram. Echocardiography is a painless test that uses sound waves to create images of your heart. It provides your doctor with information about the size and shape of your heart and how well your heart's chambers and valves are working. This procedure takes approximately one hour. There are no restrictions for this procedure.  2.  Your physician has requested that you have cardiac CT. Cardiac computed tomography (CT) is a painless test that uses an x-ray machine to take clear, detailed pictures of your heart.    Your cardiac CT will be scheduled at one of the below locations:   [facility] [address] [place], [place] [address] [phone]  [facility] [address] [place], [place] [address] [phone]  If scheduled at [facility], please arrive at the [facility] main entrance of [facility] 30 minutes prior to test start time. Proceed to the [facility] Radiology Department (first floor) to check-in and test prep.  If scheduled at [facility], please arrive 15 mins early for check-in and test prep.  Please follow these instructions carefully (unless otherwise directed):  Hold all erectile dysfunction medications at least 3 days (72 hrs) prior to test.  On the Night Before the Test: . Be sure to Drink plenty of water. . Do not consume any caffeinated/decaffeinated beverages or chocolate 12 hours prior to your test. . Do not take any antihistamines 12 hours prior to your test.   On the Day of the Test: . Drink plenty of water. Do not drink any water within one hour of the test. . Do not eat any food 4 hours prior to the test. . You may take your regular medications prior to the test.  . Take metoprolol (Lopressor) two hours prior to test.  After the Test: . Drink plenty of water. . After receiving IV contrast, you may experience a mild flushed  feeling. This is normal. . On occasion, you may experience a mild rash up to 24 hours after the test. This is not dangerous. If this occurs, you can take Benadryl 25 mg and increase your fluid intake. . If you experience trouble breathing, this can be serious. If it is severe call 911 IMMEDIATELY. If it is mild, please call our office. . If you take any of these medications: Glipizide/Metformin, Avandament, Glucavance, please do not take 48 hours after completing test unless otherwise instructed.   Once we have confirmed authorization from your insurance company, we will call you to set up a date and time for your test. Based on how quickly your insurance processes prior authorizations requests, please allow up to 4 weeks to be contacted for scheduling your Cardiac CT appointment. Be advised that routine Cardiac CT appointments could be scheduled as many as 8 weeks after your provider has ordered it.  For non-scheduling related questions, please contact the cardiac imaging nurse navigator should you have any questions/concerns: Marchia Bond, Cardiac Imaging Nurse Navigator Burley Saver, Interim Cardiac Imaging Nurse [place] [facility] Direct Office Dial: [phone]   For scheduling needs, including cancellations and rescheduling, please call Vivien Rota at [phone], option 3.     Follow-Up: At [facility], you and your health needs are our priority.  As part of our continuing mission to provide you with exceptional heart care, we have created designated Provider Care Teams.  These Care Teams include your primary Cardiologist (physician) and Advanced Practice Providers (APPs -  Physician Assistants and Nurse Practitioners) who all work together to provide you with the care you need, when you need it.  We recommend signing up for the patient portal called "MyChart".  Sign up information is provided on this After Visit Summary.  MyChart is used to connect with patients  for Virtual Visits (Telemedicine).  Patients are able to view lab/test results, encounter notes, upcoming appointments, etc.  Non-urgent messages can be sent to your provider as well.   To learn more about what you can do with MyChart, go to [email or web address].    Your next appointment:   Follow up after Echo and CTA   The format for your next appointment:   In Person  Provider:   Kate Sable, MD   Other Instructions      Signed, Kate Sable, MD  10/08/2019 2:41 PM    [place]

## 2019-10-08 NOTE — Patient Instructions (Signed)
Medication Instructions:   Your physician has recommended you make the following change in your medication:   1.  START taking omeprazole (PRILOSEC OTC) 20 MG tablet: Take 1 tablet (2.5 mg total) by mouth daily. 2.  SEE CT pre-procedure instructions below.  *If you need a refill on your cardiac medications before your next appointment, please call your pharmacy*   Lab Work: If you have labs (blood work) drawn today and your tests are completely normal, you will receive your results only by: Marland Kitchen MyChart Message (if you have MyChart) OR . A paper copy in the mail If you have any lab test that is abnormal or we need to change your treatment, we will call you to review the results.   Testing/Procedures:  1.  Your physician has requested that you have an echocardiogram. Echocardiography is a painless test that uses sound waves to create images of your heart. It provides your doctor with information about the size and shape of your heart and how well your heart's chambers and valves are working. This procedure takes approximately one hour. There are no restrictions for this procedure.  2.  Your physician has requested that you have cardiac CT. Cardiac computed tomography (CT) is a painless test that uses an x-ray machine to take clear, detailed pictures of your heart.    Your cardiac CT will be scheduled at one of the below locations:   [facility] [address] [place], [place] [address] [phone]  [place] [address] [place], [place] [address] [phone]  If scheduled at [facility], please arrive at the [facility] main entrance of [facility] 30 minutes prior to test start time. Proceed to the [facility] Radiology Department (first floor) to check-in and test prep.  If scheduled at [facility], please arrive 15 mins early for check-in and test prep.  Please  follow these instructions carefully (unless otherwise directed):  Hold all erectile dysfunction medications at least 3 days (72 hrs) prior to test.  On the Night Before the Test: . Be sure to Drink plenty of water. . Do not consume any caffeinated/decaffeinated beverages or chocolate 12 hours prior to your test. . Do not take any antihistamines 12 hours prior to your test.   On the Day of the Test: . Drink plenty of water. Do not drink any water within one hour of the test. . Do not eat any food 4 hours prior to the test. . You may take your regular medications prior to the test.  . Take metoprolol (Lopressor) two hours prior to test.        After the Test: . Drink plenty of water. . After receiving IV contrast, you may experience a mild flushed feeling. This is normal. . On occasion, you may experience a mild rash up to 24 hours after the test. This is not dangerous. If this occurs, you can take Benadryl 25 mg and increase your fluid intake. . If you experience trouble breathing, this can be serious. If it is severe call 911 IMMEDIATELY. If it is mild, please call our office. . If you take any of these medications: Glipizide/Metformin, Avandament, Glucavance, please do not take 48 hours after completing test unless otherwise instructed.   Once we have confirmed authorization from your insurance company, we will call you to set up a date and time for your test. Based on how quickly your insurance processes prior  authorizations requests, please allow up to 4 weeks to be contacted for scheduling your Cardiac CT appointment. Be advised that routine Cardiac CT appointments could be scheduled as many as 8 weeks after your provider has ordered it.  For non-scheduling related questions, please contact the cardiac imaging nurse navigator should you have any questions/concerns: Marchia Bond, Cardiac Imaging Nurse Navigator Burley Saver, Interim Cardiac Imaging Nurse [place] and  Vascular Services Direct Office Dial: [phone]   For scheduling needs, including cancellations and rescheduling, please call Vivien Rota at [phone], option [phone].     Follow-Up: At [facility], you and your health needs are our priority.  As part of our continuing mission to provide you with exceptional heart care, we have created designated Provider Care Teams.  These Care Teams include your primary Cardiologist (physician) and Advanced Practice Providers (APPs -  Physician Assistants and Nurse Practitioners) who all work together to provide you with the care you need, when you need it.  We recommend signing up for the patient portal called "MyChart".  Sign up information is provided on this After Visit Summary.  MyChart is used to connect with patients for Virtual Visits (Telemedicine).  Patients are able to view lab/test results, encounter notes, upcoming appointments, etc.  Non-urgent messages can be sent to your provider as well.   To learn more about what you can do with MyChart, go to [email or web address].    Your next appointment:   Follow up after Echo and CTA   The format for your next appointment:   In Person  Provider:   Kate Sable, MD   Other Instructions

## 2019-10-12 ENCOUNTER — Other Ambulatory Visit: Payer: Self-pay

## 2019-10-12 ENCOUNTER — Encounter: Payer: Self-pay | Admitting: Family

## 2019-10-12 ENCOUNTER — Ambulatory Visit (INDEPENDENT_AMBULATORY_CARE_PROVIDER_SITE_OTHER): Payer: 59 | Admitting: Family

## 2019-10-12 DIAGNOSIS — R1013 Epigastric pain: Secondary | ICD-10-CM | POA: Diagnosis not present

## 2019-10-12 DIAGNOSIS — I10 Essential (primary) hypertension: Secondary | ICD-10-CM

## 2019-10-12 DIAGNOSIS — R079 Chest pain, unspecified: Secondary | ICD-10-CM | POA: Insufficient documentation

## 2019-10-12 DIAGNOSIS — K625 Hemorrhage of anus and rectum: Secondary | ICD-10-CM

## 2019-10-12 NOTE — Assessment & Plan Note (Signed)
Resolved. Occult card negative x 1. No changes to bowel habits . Weight stable. Will follow

## 2019-10-12 NOTE — Assessment & Plan Note (Addendum)
No chest pain today. Patient well appearing and no distress. Doesn't occur with exertional. Not reproducible. He is pending cardiac evaluation with [facility] ( CTA, echo). Discussed differentials including GERD, PUD, musculoskeletal etiology.  He will maintain close vigilance and I have given him education of when to present to ed. Education provided on AVS on GERD foods, advised to keep log. Start prilosec with close follow up.

## 2019-10-12 NOTE — Patient Instructions (Addendum)
Take amlodipine 2.5mg  if blood pressure > 130/90.   It is imperative that you are seen AT least twice per year for labs and monitoring. Monitor blood pressure at home and me 5-6 reading on separate days. Goal is less than 120/80, based on newest guidelines, however we certainly want to be less than 130/80;  if persistently higher, please make sooner follow up appointment so we can recheck you blood pressure and manage/ adjust medications.  Please start prilosec once per day an hour before your largest meal.   Pay attention to food triggers and keep a journal.   If any changes in chest pain, development of exertional chest pain or pressure, numbness or tingling radiating to left arm or jaw, palpitations, dizziness, frequent headaches, changes in vision, or shortness of breath, please go to emergency room.     Food Choices for Gastroesophageal Reflux Disease, Adult When you have gastroesophageal reflux disease (GERD), the foods you eat and your eating habits are very important. Choosing the right foods can help ease your discomfort. Think about working with a nutrition specialist (dietitian) to help you make good choices. What are tips for following this plan?  Meals  Choose healthy foods that are low in fat, such as fruits, vegetables, whole grains, low-fat dairy products, and lean meat, fish, and poultry.  Eat small meals often instead of 3 large meals a day. Eat your meals slowly, and in a place where you are relaxed. Avoid bending over or lying down until 2-3 hours after eating.  Avoid eating meals 2-3 hours before bed.  Avoid drinking a lot of liquid with meals.  Cook foods using methods other than frying. Bake, grill, or broil food instead.  Avoid or limit: ? Chocolate. ? Peppermint or spearmint. ? Alcohol. ? Pepper. ? Black and decaffeinated coffee. ? Black and decaffeinated tea. ? Bubbly (carbonated) soft drinks. ? Caffeinated energy drinks and soft drinks.  Limit high-fat  foods such as: ? Fatty meat or fried foods. ? Whole milk, cream, butter, or ice cream. ? Nuts and nut butters. ? Pastries, donuts, and sweets made with butter or shortening.  Avoid foods that cause symptoms. These foods may be different for everyone. Common foods that cause symptoms include: ? Tomatoes. ? Oranges, lemons, and limes. ? Peppers. ? Spicy food. ? Onions and garlic. ? Vinegar. Lifestyle  Maintain a healthy weight. Ask your doctor what weight is healthy for you. If you need to lose weight, work with your doctor to do so safely.  Exercise for at least 30 minutes for 5 or more days each week, or as told by your doctor.  Wear loose-fitting clothes.  Do not smoke. If you need help quitting, ask your doctor.  Sleep with the head of your bed higher than your feet. Use a wedge under the mattress or blocks under the bed frame to raise the head of the bed. Summary  When you have gastroesophageal reflux disease (GERD), food and lifestyle choices are very important in easing your symptoms.  Eat small meals often instead of 3 large meals a day. Eat your meals slowly, and in a place where you are relaxed.  Limit high-fat foods such as fatty meat or fried foods.  Avoid bending over or lying down until 2-3 hours after eating.  Avoid peppermint and spearmint, caffeine, alcohol, and chocolate. This information is not intended to replace advice given to you by your health care provider. Make sure you discuss any questions you have with your health  care provider. Document Revised: 05/22/2018 Document Reviewed: 03/06/2016 Elsevier Patient Education  [place].

## 2019-10-12 NOTE — Progress Notes (Signed)
Subjective:    Patient ID: Alan Collier, male    DOB: [birth year], 44 y.o.   MRN: [id number]  CC: Alan Collier is a 44 y.o. male who presents today for follow up.   HPI:  HTN- hasnt taken amlodipine in one month as optum hasnt mailed amlodipine.  Took irbesartan one hour ago. Takes irbesartan qam.  At home 130/85 appears to be his average. Blood pressure is higher in evenings 150/ 85.  Chest pain- epigastric to left sided. comes and goes past 2 weeks.reports around 5-10 episodes, lasts up to 45 minutes.  No pain today. Describes a a 'hollow' feeling center to left of chest. Pain is not associated with activity. More often he noticed at nighttime hours after supper.  No pain with moving arms.  Not associated with  exertional chest pain or pressure, numbness or tingling radiating to left arm or jaw, palpitations, dizziness, frequent headaches, changes in vision, or shortness of breath.  Last week felt burning and burping after eating breakfast. Not taking prilosec. No abdominal pain.  1-2 cup coffee per day. Non smoker.  Uses wrench at work.  No nsaid. Alcohol on weekends. Weight stable; states that Weight is typically around 170 lbs.  He states that he discussed this with Dr Alan Collier.   No further rectal bleeding. No changes in bowel habits.  consult with dr Alan Collier 08/12/19 Consult with dr Alan Collier 10/08/19- pending CTA, echo. Refilled amlodipine, continue arb  HISTORY:  Past Medical History:  Diagnosis Date  . Hypertension    Past Surgical History:  Procedure Laterality Date  . STAPEDECTOMY Bilateral    Family History  Problem Relation Age of Onset  . Hypertension Mother   . Hypertension Maternal Grandmother   . Diabetes Maternal Grandmother   . Gastric cancer Maternal Grandmother   . Prostate cancer Neg Hx   . Colon cancer Neg Hx     Allergies: Iodine and Shellfish allergy Current Outpatient Medications on File Prior to Visit  Medication Sig Dispense Refill  . irbesartan  (AVAPRO) 300 MG tablet Take 1 tablet (300 mg total) by mouth daily. 90 tablet 1  . omeprazole (PRILOSEC OTC) 20 MG tablet Take 1 tablet (20 mg total) by mouth daily. 90 tablet 0  . amLODipine (NORVASC) 2.5 MG tablet Take 1 tablet (2.5 mg total) by mouth daily. (Patient not taking: Reported on 10/12/2019) 30 tablet 5  . hydrocortisone (ANUSOL-HC) 25 MG suppository Place 1 suppository (25 mg total) rectally 2 (two) times daily. (Patient not taking: Reported on 10/12/2019) 12 suppository 0  . metoprolol tartrate (LOPRESSOR) 100 MG tablet Take 1 tablet (100 mg total) by mouth once for 1 dose. Take 2 hours prior to your CT scan. (Patient not taking: Reported on 10/12/2019) 1 tablet 0   No current facility-administered medications on file prior to visit.    Social History   Tobacco Use  . Smoking status: Never Smoker  . Smokeless tobacco: Never Used  Vaping Use  . Vaping Use: Never used  Substance Use Topics  . Alcohol use: Yes    Comment: 1-3 beers per week  . Drug use: Never    Review of Systems  Constitutional: Negative for chills and fever.  Respiratory: Negative for cough and shortness of breath.   Cardiovascular: Positive for chest pain. Negative for palpitations and leg swelling.  Gastrointestinal: Negative for abdominal pain, anal bleeding, nausea and vomiting.      Objective:    BP 110/82   Pulse 77   Temp  98.1 F (36.7 C)   Ht 5\' 3"  (1.6 m)   Wt 169 lb 3.2 oz (76.7 kg)   SpO2 96%   BMI 29.97 kg/m  BP Readings from Last 3 Encounters:  10/12/19 110/82  10/08/19 (!) 150/100  08/12/19 132/83   Wt Readings from Last 3 Encounters:  10/12/19 169 lb 3.2 oz (76.7 kg)  10/08/19 170 lb 4 oz (77.2 kg)  08/12/19 176 lb (79.8 kg)    Physical Exam Vitals reviewed.  Constitutional:      Appearance: He is well-developed.  Cardiovascular:     Rate and Rhythm: Regular rhythm.     Heart sounds: Normal heart sounds.  Pulmonary:     Effort: Pulmonary effort is normal. No  respiratory distress.     Breath sounds: Normal breath sounds. No wheezing, rhonchi or rales.  Chest:     Chest wall: No tenderness.     Comments: No cp with movement of bilateral arms Skin:    General: Skin is warm and dry.  Neurological:     Mental Status: He is alert.  Psychiatric:        Speech: Speech normal.        Behavior: Behavior normal.        Assessment & Plan:   Problem List Items Addressed This Visit      Cardiovascular and Mediastinum   Hypertension    Appears labile. Advised amlodipine if > 130/80 and to give in the afternoons after checking blood pressure as this is when it tends to be elevated. He will let me how he is doing        Digestive   Rectal bleeding    Resolved. Occult card negative x 1. No changes to bowel habits . Weight stable. Will follow        Other   Epigastric abdominal pain    No chest pain today. Patient well appearing and no distress. Doesn't occur with exertional. Not reproducible. He is pending cardiac evaluation with [facility] ( CTA, echo). Discussed differentials including GERD, PUD, musculoskeletal etiology.  He will maintain close vigilance and I have given him education of when to present to ed. Education provided on AVS on GERD foods, advised to keep log. Start prilosec with close follow up.           I am having Alan Collier maintain his hydrocortisone, irbesartan, omeprazole, amLODipine, and metoprolol tartrate.   No orders of the defined types were placed in this encounter.   Return precautions given.   Risks, benefits, and alternatives of the medications and treatment plan prescribed today were discussed, and patient expressed understanding.   Education regarding symptom management and diagnosis given to patient on AVS.  Continue to follow with Burnard Hawthorne, FNP for routine health maintenance.   [facility] and I agreed with plan.   Alan Paris, FNP

## 2019-10-12 NOTE — Assessment & Plan Note (Signed)
Appears labile. Advised amlodipine if > 130/80 and to give in the afternoons after checking blood pressure as this is when it tends to be elevated. He will let me how he is doing

## 2019-10-13 ENCOUNTER — Telehealth: Payer: Self-pay

## 2019-10-13 DIAGNOSIS — R072 Precordial pain: Secondary | ICD-10-CM

## 2019-10-13 MED ORDER — PREDNISONE 50 MG PO TABS: ORAL_TABLET | ORAL | 0 refills | Status: DC

## 2019-10-13 NOTE — Telephone Encounter (Signed)
-----   Message from Roosvelt Maser sent at 10/13/2019  1:43 PM EDT ----- Regarding: ct heart  Patient has an Iodine allergy and shellfish allergy. He is not sure if this is topical or injectable iodine, so to be safe he will need to scheduled in [place].  I had him scheduled at [facility], so I will need a new order placed to r/s to [facility]  He will need an allegy prep called into his pharmacy  Please let me know when this has been done and I will call him to schedule at [facility]  Thank you, Vivien Rota

## 2019-10-13 NOTE — Telephone Encounter (Signed)
Called and left a detailed message with patient per DPR on file and also sent a MyChart message with the following instructions for pre-medication.  1. Prednisone 50 mg - take 13 hours prior to test 2. Take another Prednisone 50 mg 7 hours prior to test 3. Take another Prednisone 50 mg 1 hour prior to test 4. Take Benadryl 50 mg 1 hour prior to test . Patient must complete all four doses of above prophylactic medications. . Patient will need a ride after test due to Benadryl.  Sent prescription into patients pharmacy, and placed a new order for [facility].

## 2019-10-29 ENCOUNTER — Ambulatory Visit (INDEPENDENT_AMBULATORY_CARE_PROVIDER_SITE_OTHER): Payer: 59

## 2019-10-29 ENCOUNTER — Other Ambulatory Visit: Payer: Self-pay

## 2019-10-29 DIAGNOSIS — R079 Chest pain, unspecified: Secondary | ICD-10-CM

## 2019-10-29 LAB — ECHOCARDIOGRAM COMPLETE
Area-P 1/2: 5.42 cm2
S' Lateral: 2.6 cm

## 2019-10-30 ENCOUNTER — Telehealth (HOSPITAL_COMMUNITY): Payer: Self-pay | Admitting: Emergency Medicine

## 2019-10-30 NOTE — Telephone Encounter (Signed)
Attempted to call patient regarding upcoming cardiac CT appointment. °Left message on voicemail with name and callback number °Dalexa Gentz RN Navigator Cardiac Imaging °[facility] °[phone] Office °[phone] Cell ° °

## 2019-10-30 NOTE — Telephone Encounter (Signed)
Pt returning phone call regarding upcoming cardiac imaging study; pt verbalizes understanding of appt date/time, parking situation and where to check in, pre-test NPO status and medications ordered, and verified current allergies; name and call back number provided for further questions should they arise Marchia Bond RN Navigator Cardiac Imaging [facility] [phone] office [phone] cell   Prednisone schedule : 9pm, 3am, 9am  Pt verbalized understanding Pt however does voice concern about getting contrast

## 2019-11-02 ENCOUNTER — Ambulatory Visit (HOSPITAL_COMMUNITY): Admission: RE | Admit: 2019-11-02 | Payer: 59 | Source: Ambulatory Visit

## 2019-11-02 ENCOUNTER — Telehealth (HOSPITAL_COMMUNITY): Payer: Self-pay | Admitting: *Deleted

## 2019-11-02 NOTE — Telephone Encounter (Signed)
error 

## 2019-11-05 ENCOUNTER — Ambulatory Visit: Payer: 59

## 2019-11-19 ENCOUNTER — Ambulatory Visit: Payer: 59 | Admitting: Cardiology

## 2019-11-23 ENCOUNTER — Other Ambulatory Visit: Payer: Self-pay

## 2019-11-23 ENCOUNTER — Telehealth (INDEPENDENT_AMBULATORY_CARE_PROVIDER_SITE_OTHER): Payer: 59 | Admitting: Family

## 2019-11-23 ENCOUNTER — Encounter: Payer: Self-pay | Admitting: Family

## 2019-11-23 DIAGNOSIS — R079 Chest pain, unspecified: Secondary | ICD-10-CM | POA: Diagnosis not present

## 2019-11-23 DIAGNOSIS — I10 Essential (primary) hypertension: Secondary | ICD-10-CM

## 2019-11-23 NOTE — Assessment & Plan Note (Addendum)
Improved with lifestyle changes. Non exertional CP consistent with gerd. Advised trial pepcid oc 20mg  bid. He will continue to follow with [name] , will follow.

## 2019-11-23 NOTE — Patient Instructions (Addendum)
Start pepcid ac 20mg  twice per day.   It is imperative that you are seen AT least twice per year for labs and monitoring.  Monitor blood pressure at home and me 5-6 reading on separate days. Goal is less than 120/80, based on newest guidelines, however we certainly want to be less than 130/80;  if persistently higher, please make sooner follow up appointment so we can recheck you blood pressure and manage/ adjust medications.

## 2019-11-23 NOTE — Assessment & Plan Note (Addendum)
Stable. Continue irbesartan 300mg  qd and takes amlodipine 2.5mg  qhs if blood pressure 130/80. Advised to be careful, slow with position changes and stay hydrated. He will let me know of any concerns.

## 2019-11-23 NOTE — Progress Notes (Signed)
Virtual Visit via Video Note  I connected with@  on 11/23/19 at 10:00 AM EDT by a video enabled telemedicine application and verified that I am speaking with the correct person using two identifiers.  Location patient: home Location provider:work  Persons participating in the virtual visit: patient, provider  I discussed the limitations of evaluation and management by telemedicine and the availability of in person appointments. The patient expressed understanding and agreed to proceed.   HPI: Feels well today No new complaints.   HTN- Improving, around 125/80 at home. compliant with irbesartan. No sob, syncope. Has lost weight, blood pressure at home has been as low as 118/75 and 'felt more dizzy than normal when bending over' when picked up something from floor. Last night 135/878 and took amlodipine 2.5mg  . Has taken amlodipine 3 times per week in the last 2 months.   Continues to left sided CP associated burping , 'acidic' taste, improved. CP has been everyday however since eating smaller portions, chest pain has improved. CP is not associated with anxiety. Triggered by laying supine, after a meal.  No sob, left arm numbness.CP improved with elevation head of bed.  CP resolves with drinking water and Tums. Stopped pepcid for 14 days then stopped a couple of weeks ago, thinks CP improved on OTC prilosec.  Never had to use Anu sol; no further episodes of rectal bleeding. No abdominal pain or constipation.   Echo is normal systolic and diastolic function.  He is waiting on CT cardiac score test. He isnt sure if he want to take prednisone, benadryl to have done. Follow up scheduled in 5 days.   ROS: See pertinent positives and negatives per HPI.    EXAM:  VITALS per patient if applicable: BP 706/23   Ht 5\' 3"  (1.6 m)   Wt 169 lb (76.7 kg)   BMI 29.94 kg/m  BP Readings from Last 3 Encounters:  11/23/19 135/88  10/12/19 110/82  10/08/19 (!) 150/100   Wt Readings from Last 3  Encounters:  11/23/19 169 lb (76.7 kg)  10/12/19 169 lb 3.2 oz (76.7 kg)  10/08/19 170 lb 4 oz (77.2 kg)   BP Readings from Last 3 Encounters:  11/23/19 135/88  10/12/19 110/82  10/08/19 (!) 150/100   Wt Readings from Last 3 Encounters:  11/23/19 169 lb (76.7 kg)  10/12/19 169 lb 3.2 oz (76.7 kg)  10/08/19 170 lb 4 oz (77.2 kg)    GENERAL: alert, oriented, appears well and in no acute distress  HEENT: atraumatic, conjunttiva clear, no obvious abnormalities on inspection of external nose and ears  NECK: normal movements of the head and neck  LUNGS: on inspection no signs of respiratory distress, breathing rate appears normal, no obvious gross SOB, gasping or wheezing  CV: no obvious cyanosis  MS: moves all visible extremities without noticeable abnormality  PSYCH/NEURO: pleasant and cooperative, no obvious depression or anxiety, speech and thought processing grossly intact  ASSESSMENT AND PLAN:  Discussed the following assessment and plan:  Problem List Items Addressed This Visit      Cardiovascular and Mediastinum   Hypertension    Stable. Continue irbesartan 300mg  qd and takes amlodipine 2.5mg  qhs if blood pressure 130/80. Advised to be careful, slow with position changes and stay hydrated. He will let me know of any concerns.         Other   Chest pain    Improved with lifestyle changes. Non exertional CP consistent with gerd. Advised trial pepcid oc 20mg  bid.  He will continue to follow with [name] , will follow.          -we discussed possible serious and likely etiologies, options for evaluation and workup, limitations of telemedicine visit vs in person visit, treatment, treatment risks and precautions. Pt prefers to treat via telemedicine empirically rather then risking or undertaking an in person visit at this moment.  .   I discussed the assessment and treatment plan with the patient. The patient was provided an opportunity to ask questions and all were  answered. The patient agreed with the plan and demonstrated an understanding of the instructions.   The patient was advised to call back or seek an in-person evaluation if the symptoms worsen or if the condition fails to improve as anticipated.   [name], FNP

## 2019-11-27 ENCOUNTER — Other Ambulatory Visit: Payer: Self-pay

## 2019-11-27 ENCOUNTER — Ambulatory Visit (INDEPENDENT_AMBULATORY_CARE_PROVIDER_SITE_OTHER): Payer: 59 | Admitting: Cardiology

## 2019-11-27 ENCOUNTER — Encounter: Payer: Self-pay | Admitting: Cardiology

## 2019-11-27 VITALS — BP 116/80 | HR 72 | Ht 62.0 in | Wt 169.0 lb

## 2019-11-27 DIAGNOSIS — I10 Essential (primary) hypertension: Secondary | ICD-10-CM

## 2019-11-27 DIAGNOSIS — R079 Chest pain, unspecified: Secondary | ICD-10-CM | POA: Diagnosis not present

## 2019-11-27 NOTE — Progress Notes (Signed)
Cardiology Office Note:    Date:  11/27/2019   ID:  Alan Collier, DOB Jan 08, 1976, MRN [id number]  PCP:  Burnard Hawthorne, FNP  [facility] Cardiologist:  Kate Sable, MD  [place] Electrophysiologist:  None   Referring MD: Burnard Hawthorne, FNP   Chief Complaint  Patient presents with  . Other    Patient would like to review test results before having CT. Meds reviewed verbally with patient.     History of Present Illness:    Alan Collier is a 44 y.o. male with a hx of hypertension who presents for follow-up.  Patient previously seen due to chest pain.  Echocardiogram and coronary CTA was ordered.  Patient had echocardiogram performed.  Wanted to review echo results before proceeding with coronary CTA.  Patient was concerned regarding iodine and shellfish allergies with regards to obtaining coronary CTA.  He still complains of on and off chest discomfort not related with exertion.   Past Medical History:  Diagnosis Date  . Hypertension     Past Surgical History:  Procedure Laterality Date  . STAPEDECTOMY Bilateral     Current Medications: Current Meds  Medication Sig  . amLODipine (NORVASC) 2.5 MG tablet Take 1 tablet (2.5 mg total) by mouth daily.  . hydrocortisone (ANUSOL-HC) 25 MG suppository Place 1 suppository (25 mg total) rectally 2 (two) times daily.  . irbesartan (AVAPRO) 300 MG tablet Take 1 tablet (300 mg total) by mouth daily.     Allergies:   Iodine and Shellfish allergy   Social History   Socioeconomic History  . Marital status: Married    Spouse name: Not on file  . Number of children: Not on file  . Years of education: Not on file  . Highest education level: Not on file  Occupational History  . Not on file  Tobacco Use  . Smoking status: Never Smoker  . Smokeless tobacco: Never Used  Vaping Use  . Vaping Use: Never used  Substance and Sexual Activity  . Alcohol use: Yes    Comment: 1-3 beers per week  . Drug use:  Never  . Sexual activity: Not on file  Other Topics Concern  . Not on file  Social History Narrative   Wife is patient   56 year old son.    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   Social Determinants of Health   Financial Resource Strain:   . Difficulty of Paying Living Expenses: Not on file  Food Insecurity:   . Worried About Charity fundraiser in the Last Year: Not on file  . Ran Out of Food in the Last Year: Not on file  Transportation Needs:   . Lack of Transportation (Medical): Not on file  . Lack of Transportation (Non-Medical): Not on file  Physical Activity:   . Days of Exercise per Week: Not on file  . Minutes of Exercise per Session: Not on file  Stress:   . Feeling of Stress : Not on file  Social Connections:   . Frequency of Communication with Friends and Family: Not on file  . Frequency of Social Gatherings with Friends and Family: Not on file  . Attends Religious Services: Not on file  . Active Member of Clubs or Organizations: Not on file  . Attends Archivist Meetings: Not on file  . Marital Status: Not on file     Family History: The patient's family history includes  Diabetes in his maternal grandmother; Gastric cancer in his maternal grandmother; Hypertension in his maternal grandmother and mother. There is no history of Prostate cancer or Colon cancer.  ROS:   Please see the history of present illness.     All other systems reviewed and are negative.  EKGs/Labs/Other Studies Reviewed:    The following studies were reviewed today:   EKG:  EKG is  ordered today.  The ekg ordered today demonstrates normal sinus rhythm, normal ECG.  Recent Labs: 07/10/2019: ALT 30; BUN 14; Creatinine, Ser 0.96; Hemoglobin 15.8; Platelets 256.0; Potassium 4.7; Sodium 137; TSH 1.44  Recent Lipid Panel    Component Value Date/Time   CHOL 152 07/10/2019 1121   TRIG 167.0 (H) 07/10/2019 1121   HDL 40.40 07/10/2019 1121    CHOLHDL 4 07/10/2019 1121   VLDL 33.4 07/10/2019 1121   LDLCALC 78 07/10/2019 1121   LDLDIRECT 76.0 06/16/2018 0932    Physical Exam:    VS:  BP 116/80 (BP Location: Left Arm, Patient Position: Sitting, Cuff Size: Normal)   Pulse 72   Ht '5\' 2"'  (1.575 m)   Wt 169 lb (76.7 kg)   SpO2 99%   BMI 30.91 kg/m     Wt Readings from Last 3 Encounters:  11/27/19 169 lb (76.7 kg)  11/23/19 169 lb (76.7 kg)  10/12/19 169 lb 3.2 oz (76.7 kg)     GEN:  Well nourished, well developed in no acute distress HEENT: Normal NECK: No JVD; No carotid bruits LYMPHATICS: No lymphadenopathy CARDIAC: RRR, no murmurs, rubs, gallops RESPIRATORY:  Clear to auscultation without rales, wheezing or rhonchi  ABDOMEN: Soft, non-tender, non-distended MUSCULOSKELETAL:  No edema; No deformity  SKIN: Warm and dry NEUROLOGIC:  Alert and oriented x 3 PSYCHIATRIC:  Normal affect   ASSESSMENT:    1. Chest pain of uncertain etiology   2. Essential hypertension    PLAN:    In order of problems listed above:  1. Patient still with history of chest pain.  Symptoms appear atypical, he has risk factors of hypertension.  Echo showed normal systolic and diastolic function, EF 60 to 65%.  Discussed with patient at length reasoning for prep with prednisone, Benadryl due to history of shellfish allergies.  Coronary CTA which was previously scheduled will be performed inpatient.  She did verbalize understanding, will plan to obtain coronary CTA exam. 2. History of hypertension, BP controlled.  Continue Avapro and amlodipine as prescribed.  Follow-up after coronary CTA.  Total encounter time 35 minutes  Greater than 50% was spent in counseling and coordination of care with the patient  This note was generated in part or whole with voice recognition software. Voice recognition is usually quite accurate but there are transcription errors that can and very often do occur. I apologize for any typographical errors that were  not detected and corrected.  Medication Adjustments/Labs and Tests Ordered: Current medicines are reviewed at length with the patient today.  Concerns regarding medicines are outlined above.  Orders Placed This Encounter  Procedures  . EKG 12-Lead   No orders of the defined types were placed in this encounter.   Patient Instructions  Medication Instructions:  - Your physician recommends that you continue on your current medications as directed. Please refer to the Current Medication list given to you today.  *If you need a refill on your cardiac medications before your next appointment, please call your pharmacy*   Lab Work: - none ordered  If you have labs (blood  work) drawn today and your tests are completely normal, you will receive your results only by: Marland Kitchen MyChart Message (if you have MyChart) OR . A paper copy in the mail If you have any lab test that is abnormal or we need to change your treatment, we will call you to review the results.   Testing/Procedures:  1) Cardiac CTA  - Your physician has requested that you have cardiac CT. Cardiac computed tomography (CT) is a painless test that uses an x-ray machine to take clear, detailed pictures of your heart.   Your cardiac CT will be scheduled at one of the below locations:   [facility] [address] [place], [place] [address] [phone]  If scheduled at [facility], please arrive at the [facility] main entrance of [facility] 30 minutes prior to test start time. Proceed to the [facility] Radiology Department (first floor) to check-in and test prep.  Please follow these instructions carefully (unless otherwise directed):  Hold all erectile dysfunction medications at least 3 days (72 hrs) prior to test.  On the Night Before the Test: . Be sure to Drink plenty of water. . Do not consume any caffeinated/decaffeinated beverages or chocolate 12 hours prior to your test. . Do not take  any antihistamines 12 hours prior to your test.  . Pre-medication for contrast allergy:  1. Prednisone 50 mg - take 13 hours prior to test 2. Take another Prednisone 50 mg 7 hours prior to test 3. Take another Prednisone 50 mg 1 hour prior to test 4. Take Benadryl 50 mg 1 hour prior to test 5. You should have a ride for after the test due to the benadryl  On the Day of the Test: . Drink plenty of water. Do not drink any water within one hour of the test. . Do not eat any food 4 hours prior to the test. . You may take your regular medications prior to the test.  . Take metoprolol (Lopressor) two hours prior to test.       After the Test: . Drink plenty of water. . After receiving IV contrast, you may experience a mild flushed feeling. This is normal. . On occasion, you may experience a mild rash up to 24 hours after the test. This is not dangerous. If this occurs, you can take Benadryl 25 mg and increase your fluid intake. . If you experience trouble breathing, this can be serious. If it is severe call 911 IMMEDIATELY. If it is mild, please call our office.   Once we have confirmed authorization from your insurance company, we will call you to set up a date and time for your test. Based on how quickly your insurance processes prior authorizations requests, please allow up to 4 weeks to be contacted for scheduling your Cardiac CT appointment. Be advised that routine Cardiac CT appointments could be scheduled as many as 8 weeks after your provider has ordered it.  For non-scheduling related questions, please contact the cardiac imaging nurse navigator should you have any questions/concerns: Marchia Bond, Cardiac Imaging Nurse Navigator Burley Saver, Interim Cardiac Imaging Nurse [facility] Direct Office Dial: [phone]   For scheduling needs, including cancellations and rescheduling, please call Vivien Rota at [phone], option 3.       Follow-Up: At [facility], you and your health needs are our priority.  As part of our continuing mission to provide you with exceptional heart care, we have created designated  Provider Care Teams.  These Care Teams include your primary Cardiologist (physician) and Advanced Practice Providers (APPs -  Physician Assistants and Nurse Practitioners) who all work together to provide you with the care you need, when you need it.  We recommend signing up for the patient portal called "MyChart".  Sign up information is provided on this After Visit Summary.  MyChart is used to connect with patients for Virtual Visits (Telemedicine).  Patients are able to view lab/test results, encounter notes, upcoming appointments, etc.  Non-urgent messages can be sent to your provider as well.   To learn more about what you can do with MyChart, go to [email or web address].    Your next appointment:   4 week(s)  The format for your next appointment:   In Person  Provider:   Kate Sable, MD   Other Instructions    Cardiac CT Angiogram A cardiac CT angiogram is a procedure to look at the heart and the area around the heart. It may be done to help find the cause of chest pains or other symptoms of heart disease. During this procedure, a substance called contrast dye is injected into the blood vessels in the area to be checked. A large X-ray machine, called a CT scanner, then takes detailed pictures of the heart and the surrounding area. The procedure is also sometimes called a coronary CT angiogram, coronary artery scanning, or CTA. A cardiac CT angiogram allows the health care provider to see how well blood is flowing to and from the heart. The health care provider will be able to see if there are any problems, such as:  Blockage or narrowing of the coronary arteries in the heart.  Fluid around the heart.  Signs of weakness or disease in the muscles, valves, and tissues of the heart. Tell a  health care provider about:  Any allergies you have. This is especially important if you have had a previous allergic reaction to contrast dye.  All medicines you are taking, including vitamins, herbs, eye drops, creams, and over-the-counter medicines.  Any blood disorders you have.  Any surgeries you have had.  Any medical conditions you have.  Whether you are pregnant or may be pregnant.  Any anxiety disorders, chronic pain, or other conditions you have that may increase your stress or prevent you from lying still. What are the risks? Generally, this is a safe procedure. However, problems may occur, including:  Bleeding.  Infection.  Allergic reactions to medicines or dyes.  Damage to other structures or organs.  Kidney damage from the contrast dye that is used.  Increased risk of cancer from radiation exposure. This risk is low. Talk with your health care provider about: ? The risks and benefits of testing. ? How you can receive the lowest dose of radiation. What happens before the procedure?  Wear comfortable clothing and remove any jewelry, glasses, dentures, and hearing aids.  Follow instructions from your health care provider about eating and drinking. This may include: ? For 12 hours before the procedure -- avoid caffeine. This includes tea, coffee, soda, energy drinks, and diet pills. Drink plenty of water or other fluids that do not have caffeine in them. Being well hydrated can prevent complications. ? For 4-6 hours before the procedure -- stop eating and drinking. The contrast dye can cause nausea, but this is less likely if your stomach is empty.  Ask your health care provider about changing or stopping your regular medicines. This is especially important if you are  taking diabetes medicines, blood thinners, or medicines to treat problems with erections (erectile dysfunction). What happens during the procedure?   Hair on your chest may need to be removed so  that small sticky patches called electrodes can be placed on your chest. These will transmit information that helps to monitor your heart during the procedure.  An IV will be inserted into one of your veins.  You might be given a medicine to control your heart rate during the procedure. This will help to ensure that good images are obtained.  You will be asked to lie on an exam table. This table will slide in and out of the CT machine during the procedure.  Contrast dye will be injected into the IV. You might feel warm, or you may get a metallic taste in your mouth.  You will be given a medicine called nitroglycerin. This will relax or dilate the arteries in your heart.  The table that you are lying on will move into the CT machine tunnel for the scan.  The person running the machine will give you instructions while the scans are being done. You may be asked to: ? Keep your arms above your head. ? Hold your breath. ? Stay very still, even if the table is moving.  When the scanning is complete, you will be moved out of the machine.  The IV will be removed. The procedure may vary among health care providers and hospitals. What can I expect after the procedure? After your procedure, it is common to have:  A metallic taste in your mouth from the contrast dye.  A feeling of warmth.  A headache from the nitroglycerin. Follow these instructions at home:  Take over-the-counter and prescription medicines only as told by your health care provider.  If you are told, drink enough fluid to keep your urine pale yellow. This will help to flush the contrast dye out of your body.  Most people can return to their normal activities right after the procedure. Ask your health care provider what activities are safe for you.  It is up to you to get the results of your procedure. Ask your health care provider, or the department that is doing the procedure, when your results will be ready.  Keep all  follow-up visits as told by your health care provider. This is important. Contact a health care provider if:  You have any symptoms of allergy to the contrast dye. These include: ? Shortness of breath. ? Rash or hives. ? A racing heartbeat. Summary  A cardiac CT angiogram is a procedure to look at the heart and the area around the heart. It may be done to help find the cause of chest pains or other symptoms of heart disease.  During this procedure, a large X-ray machine, called a CT scanner, takes detailed pictures of the heart and the surrounding area after a contrast dye has been injected into blood vessels in the area.  Ask your health care provider about changing or stopping your regular medicines before the procedure. This is especially important if you are taking diabetes medicines, blood thinners, or medicines to treat erectile dysfunction.  If you are told, drink enough fluid to keep your urine pale yellow. This will help to flush the contrast dye out of your body. This information is not intended to replace advice given to you by your health care provider. Make sure you discuss any questions you have with your health care provider. Document Revised: 09/24/2018  Document Reviewed: 09/24/2018 Elsevier Patient Education  2020 Parker, Kate Sable, MD  11/27/2019 12:40 PM    [place]

## 2019-11-27 NOTE — Patient Instructions (Addendum)
Medication Instructions:  - Your physician recommends that you continue on your current medications as directed. Please refer to the Current Medication list given to you today.  *If you need a refill on your cardiac medications before your next appointment, please call your pharmacy*   Lab Work: - none ordered  If you have labs (blood work) drawn today and your tests are completely normal, you will receive your results only by: Marland Kitchen MyChart Message (if you have MyChart) OR . A paper copy in the mail If you have any lab test that is abnormal or we need to change your treatment, we will call you to review the results.   Testing/Procedures:  1) Cardiac CTA  - Your physician has requested that you have cardiac CT. Cardiac computed tomography (CT) is a painless test that uses an x-ray machine to take clear, detailed pictures of your heart.   Your cardiac CT will be scheduled at one of the below locations:   [facility] [address] [place], [place] [address] [phone]  If scheduled at [facility], please arrive at the [facility] main entrance of [facility] 30 minutes prior to test start time. Proceed to the [facility] Radiology Department (first floor) to check-in and test prep.  Please follow these instructions carefully (unless otherwise directed):  Hold all erectile dysfunction medications at least 3 days (72 hrs) prior to test.  On the Night Before the Test: . Be sure to Drink plenty of water. . Do not consume any caffeinated/decaffeinated beverages or chocolate 12 hours prior to your test. . Do not take any antihistamines 12 hours prior to your test.  . Pre-medication for contrast allergy:  1. Prednisone 50 mg - take 13 hours prior to test 2. Take another Prednisone 50 mg 7 hours prior to test 3. Take another Prednisone 50 mg 1 hour prior to test 4. Take Benadryl 50 mg 1 hour prior to test 5. You should have a ride for after the test due  to the benadryl  On the Day of the Test: . Drink plenty of water. Do not drink any water within one hour of the test. . Do not eat any food 4 hours prior to the test. . You may take your regular medications prior to the test.  . Take metoprolol (Lopressor) two hours prior to test.       After the Test: . Drink plenty of water. . After receiving IV contrast, you may experience a mild flushed feeling. This is normal. . On occasion, you may experience a mild rash up to 24 hours after the test. This is not dangerous. If this occurs, you can take Benadryl 25 mg and increase your fluid intake. . If you experience trouble breathing, this can be serious. If it is severe call 911 IMMEDIATELY. If it is mild, please call our office.   Once we have confirmed authorization from your insurance company, we will call you to set up a date and time for your test. Based on how quickly your insurance processes prior authorizations requests, please allow up to 4 weeks to be contacted for scheduling your Cardiac CT appointment. Be advised that routine Cardiac CT appointments could be scheduled as many as 8 weeks after your provider has ordered it.  For non-scheduling related questions, please contact the cardiac imaging nurse navigator should you have any questions/concerns: Marchia Bond, Cardiac Imaging Nurse Navigator Burley Saver, Interim Cardiac Imaging Nurse Navigator [facility] Direct Office Dial: [phone]   For scheduling needs, including cancellations and rescheduling, please call Vivien Rota at [phone], option 3.      Follow-Up: At [facility], you and your health needs are our priority.  As part of our continuing mission to provide you with exceptional heart care, we have created designated Provider Care Teams.  These Care Teams include your primary Cardiologist (physician) and Advanced Practice Providers (APPs -  Physician Assistants and Nurse Practitioners) who all  work together to provide you with the care you need, when you need it.  We recommend signing up for the patient portal called "MyChart".  Sign up information is provided on this After Visit Summary.  MyChart is used to connect with patients for Virtual Visits (Telemedicine).  Patients are able to view lab/test results, encounter notes, upcoming appointments, etc.  Non-urgent messages can be sent to your provider as well.   To learn more about what you can do with MyChart, go to [email or web address].    Your next appointment:   4 week(s)  The format for your next appointment:   In Person  Provider:   Kate Sable, MD   Other Instructions    Cardiac CT Angiogram A cardiac CT angiogram is a procedure to look at the heart and the area around the heart. It may be done to help find the cause of chest pains or other symptoms of heart disease. During this procedure, a substance called contrast dye is injected into the blood vessels in the area to be checked. A large X-ray machine, called a CT scanner, then takes detailed pictures of the heart and the surrounding area. The procedure is also sometimes called a coronary CT angiogram, coronary artery scanning, or CTA. A cardiac CT angiogram allows the health care provider to see how well blood is flowing to and from the heart. The health care provider will be able to see if there are any problems, such as:  Blockage or narrowing of the coronary arteries in the heart.  Fluid around the heart.  Signs of weakness or disease in the muscles, valves, and tissues of the heart. Tell a health care provider about:  Any allergies you have. This is especially important if you have had a previous allergic reaction to contrast dye.  All medicines you are taking, including vitamins, herbs, eye drops, creams, and over-the-counter medicines.  Any blood disorders you have.  Any surgeries you have had.  Any medical conditions you have.  Whether you  are pregnant or may be pregnant.  Any anxiety disorders, chronic pain, or other conditions you have that may increase your stress or prevent you from lying still. What are the risks? Generally, this is a safe procedure. However, problems may occur, including:  Bleeding.  Infection.  Allergic reactions to medicines or dyes.  Damage to other structures or organs.  Kidney damage from the contrast dye that is used.  Increased risk of cancer from radiation exposure. This risk is low. Talk with your health care provider about: ? The risks and benefits of testing. ? How you can receive the lowest dose of radiation. What happens before the procedure?  Wear comfortable clothing and remove any jewelry, glasses, dentures, and hearing aids.  Follow instructions from your health care provider about eating and drinking. This may include: ? For 12 hours before the procedure -- avoid caffeine. This includes tea, coffee, soda, energy drinks, and diet pills. Drink plenty of water or other fluids that do not  have caffeine in them. Being well hydrated can prevent complications. ? For 4-6 hours before the procedure -- stop eating and drinking. The contrast dye can cause nausea, but this is less likely if your stomach is empty.  Ask your health care provider about changing or stopping your regular medicines. This is especially important if you are taking diabetes medicines, blood thinners, or medicines to treat problems with erections (erectile dysfunction). What happens during the procedure?   Hair on your chest may need to be removed so that small sticky patches called electrodes can be placed on your chest. These will transmit information that helps to monitor your heart during the procedure.  An IV will be inserted into one of your veins.  You might be given a medicine to control your heart rate during the procedure. This will help to ensure that good images are obtained.  You will be asked to lie  on an exam table. This table will slide in and out of the CT machine during the procedure.  Contrast dye will be injected into the IV. You might feel warm, or you may get a metallic taste in your mouth.  You will be given a medicine called nitroglycerin. This will relax or dilate the arteries in your heart.  The table that you are lying on will move into the CT machine tunnel for the scan.  The person running the machine will give you instructions while the scans are being done. You may be asked to: ? Keep your arms above your head. ? Hold your breath. ? Stay very still, even if the table is moving.  When the scanning is complete, you will be moved out of the machine.  The IV will be removed. The procedure may vary among health care providers and hospitals. What can I expect after the procedure? After your procedure, it is common to have:  A metallic taste in your mouth from the contrast dye.  A feeling of warmth.  A headache from the nitroglycerin. Follow these instructions at home:  Take over-the-counter and prescription medicines only as told by your health care provider.  If you are told, drink enough fluid to keep your urine pale yellow. This will help to flush the contrast dye out of your body.  Most people can return to their normal activities right after the procedure. Ask your health care provider what activities are safe for you.  It is up to you to get the results of your procedure. Ask your health care provider, or the department that is doing the procedure, when your results will be ready.  Keep all follow-up visits as told by your health care provider. This is important. Contact a health care provider if:  You have any symptoms of allergy to the contrast dye. These include: ? Shortness of breath. ? Rash or hives. ? A racing heartbeat. Summary  A cardiac CT angiogram is a procedure to look at the heart and the area around the heart. It may be done to help find  the cause of chest pains or other symptoms of heart disease.  During this procedure, a large X-ray machine, called a CT scanner, takes detailed pictures of the heart and the surrounding area after a contrast dye has been injected into blood vessels in the area.  Ask your health care provider about changing or stopping your regular medicines before the procedure. This is especially important if you are taking diabetes medicines, blood thinners, or medicines to treat erectile dysfunction.  If you are told, drink enough fluid to keep your urine pale yellow. This will help to flush the contrast dye out of your body. This information is not intended to replace advice given to you by your health care provider. Make sure you discuss any questions you have with your health care provider. Document Revised: 09/24/2018 Document Reviewed: 09/24/2018 Elsevier Patient Education  [employer].

## 2019-12-21 ENCOUNTER — Encounter: Payer: Self-pay | Admitting: Nurse Practitioner

## 2019-12-21 ENCOUNTER — Ambulatory Visit (INDEPENDENT_AMBULATORY_CARE_PROVIDER_SITE_OTHER): Payer: 59 | Admitting: Nurse Practitioner

## 2019-12-21 ENCOUNTER — Telehealth: Payer: Self-pay | Admitting: Family

## 2019-12-21 ENCOUNTER — Other Ambulatory Visit: Payer: Self-pay

## 2019-12-21 VITALS — BP 122/84 | HR 71 | Temp 97.9°F | Ht 62.0 in | Wt 171.0 lb

## 2019-12-21 DIAGNOSIS — S00412A Abrasion of left ear, initial encounter: Secondary | ICD-10-CM | POA: Diagnosis not present

## 2019-12-21 MED ORDER — CIPROFLOXACIN-DEXAMETHASONE 0.3-0.1 % OT SUSP: 4.0000 [drp] | Freq: Two times a day (BID) | OTIC | 0 refills | Status: AC

## 2019-12-21 NOTE — Telephone Encounter (Signed)
Pt wanted a call back about the ciprofloxacin-dexamethasone (CIPRODEX) OTIC suspension that he received today

## 2019-12-21 NOTE — Addendum Note (Signed)
Addended by: Denice Paradise A on: 12/21/2019 09:33 AM   Modules accepted: Orders

## 2019-12-21 NOTE — Progress Notes (Addendum)
Established Patient Office Visit  Subjective:  Patient ID: Alan Collier, male    DOB: 1976-02-03  Age: 44 y.o. MRN: [id number]  CC:  Chief Complaint  Patient presents with  . Acute Visit    left ear bleeding    HPI Alan Collier presents for bleeding noted left ear. On Fri after a shower he used a Q tip to soak up the water and he notice small amount  blood on the Q tip. He did not feel a scratch with the Q tip or with his finger. He has noticed no pain or change in hearing in that ear. He thinks it may be a surface scratch. He is deaf ear in that ear. He called his ENT and recommended he start here. He had an ear infection last year and felt that. He lost 90% hearing on left ear- from 2 past surgeries. In 2018- he noticed hearing faded and has constant buzzing noise there. No URI symptoms he is well.  He has plans to eventually see a [facility] specialist for more surgery to restore his hearing.   Past Medical History:  Diagnosis Date  . Hypertension     Past Surgical History:  Procedure Laterality Date  . STAPEDECTOMY Bilateral     Family History  Problem Relation Age of Onset  . Hypertension Mother   . Hypertension Maternal Grandmother   . Diabetes Maternal Grandmother   . Gastric cancer Maternal Grandmother   . Prostate cancer Neg Hx   . Colon cancer Neg Hx     Social History   Socioeconomic History  . Marital status: Married    Spouse name: Not on file  . Number of children: Not on file  . Years of education: Not on file  . Highest education level: Not on file  Occupational History  . Not on file  Tobacco Use  . Smoking status: Never Smoker  . Smokeless tobacco: Never Used  Vaping Use  . Vaping Use: Never used  Substance and Sexual Activity  . Alcohol use: Yes    Comment: 1-3 beers per week  . Drug use: Never  . Sexual activity: Not on file  Other Topics Concern  . Not on file  Social History Narrative   Wife is patient   52 year old son.    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   Social Determinants of Health   Financial Resource Strain:   . Difficulty of Paying Living Expenses: Not on file  Food Insecurity:   . Worried About Charity fundraiser in the Last Year: Not on file  . Ran Out of Food in the Last Year: Not on file  Transportation Needs:   . Lack of Transportation (Medical): Not on file  . Lack of Transportation (Non-Medical): Not on file  Physical Activity:   . Days of Exercise per Week: Not on file  . Minutes of Exercise per Session: Not on file  Stress:   . Feeling of Stress : Not on file  Social Connections:   . Frequency of Communication with Friends and Family: Not on file  . Frequency of Social Gatherings with Friends and Family: Not on file  . Attends Religious Services: Not on file  . Active Member of Clubs or Organizations: Not on file  . Attends Archivist Meetings: Not on file  . Marital Status: Not on file  Intimate Partner Violence:   . Fear of  Current or Ex-Partner: Not on file  . Emotionally Abused: Not on file  . Physically Abused: Not on file  . Sexually Abused: Not on file    Outpatient Medications Prior to Visit  Medication Sig Dispense Refill  . amLODipine (NORVASC) 2.5 MG tablet Take 1 tablet (2.5 mg total) by mouth daily. 30 tablet 5  . irbesartan (AVAPRO) 300 MG tablet Take 1 tablet (300 mg total) by mouth daily. 90 tablet 1  . hydrocortisone (ANUSOL-HC) 25 MG suppository Place 1 suppository (25 mg total) rectally 2 (two) times daily. (Patient not taking: Reported on 12/21/2019) 12 suppository 0   No facility-administered medications prior to visit.    Allergies  Allergen Reactions  . Iodine     Hives and itching   . Shellfish Allergy     Hives and itching     Review of Systems Pertinent positives noted in history of present illness and otherwise negative.   Objective:    Physical Exam Constitutional:      Appearance: Normal  appearance.  HENT:     Head: Normocephalic.     Right Ear: Tympanic membrane, ear canal and external ear normal.     Ears:     Comments: Left ear s/p ear surgery and has abnormal TM it is ruptured.Tm is not reddened.    A small cratered ulcer noted inside ear opening in canal- likely source of his blood on Qtip.    Musculoskeletal:     Cervical back: Normal range of motion.  Lymphadenopathy:     Cervical: No cervical adenopathy.  Neurological:     Mental Status: He is alert.     BP 122/84 (BP Location: Left Arm, Patient Position: Sitting, Cuff Size: Normal)   Pulse 71   Temp 97.9 F (36.6 C) (Oral)   Ht 5\' 2"  (1.575 m)   Wt 171 lb (77.6 kg)   SpO2 99%   BMI 31.28 kg/m  Wt Readings from Last 3 Encounters:  12/21/19 171 lb (77.6 kg)  11/27/19 169 lb (76.7 kg)  11/23/19 169 lb (76.7 kg)   Pulse Readings from Last 3 Encounters:  12/21/19 71  11/27/19 72  10/12/19 77    BP Readings from Last 3 Encounters:  12/21/19 122/84  11/27/19 116/80  11/23/19 135/88    Lab Results  Component Value Date   CHOL 152 07/10/2019   HDL 40.40 07/10/2019   LDLCALC 78 07/10/2019   LDLDIRECT 76.0 06/16/2018   TRIG 167.0 (H) 07/10/2019   CHOLHDL 4 07/10/2019      Health Maintenance Due  Topic Date Due  . Hepatitis C Screening  Never done  . HIV Screening  Never done  . INFLUENZA VACCINE  Never done    There are no preventive care reminders to display for this patient.  Lab Results  Component Value Date   TSH 1.44 07/10/2019   Lab Results  Component Value Date   WBC 5.5 07/10/2019   HGB 15.8 07/10/2019   HCT 46.6 07/10/2019   MCV 87.8 07/10/2019   PLT 256.0 07/10/2019   Lab Results  Component Value Date   NA 137 07/10/2019   K 4.7 07/10/2019   CO2 30 07/10/2019   GLUCOSE 101 (H) 07/10/2019   BUN 14 07/10/2019   CREATININE 0.96 07/10/2019   BILITOT 0.8 07/10/2019   ALKPHOS 45 07/10/2019   AST 17 07/10/2019   ALT 30 07/10/2019   PROT 7.3 07/10/2019   ALBUMIN  4.7 07/10/2019   CALCIUM 9.8 07/10/2019  GFR 85.48 07/10/2019   Lab Results  Component Value Date   CHOL 152 07/10/2019   Lab Results  Component Value Date   HDL 40.40 07/10/2019   Lab Results  Component Value Date   LDLCALC 78 07/10/2019   Lab Results  Component Value Date   TRIG 167.0 (H) 07/10/2019   Lab Results  Component Value Date   CHOLHDL 4 07/10/2019   Lab Results  Component Value Date   HGBA1C 4.7 07/10/2019      Assessment & Plan:   Problem List Items Addressed This Visit      Nervous and Auditory   Abrasion of left ear canal - Primary      No orders of the defined types were placed in this encounter.  Left ear s/p ear surgery and has abnormal TM it is ruptured.Tm is not reddened.    A small cratered ulcer noted inside ear opening in canal- likely source of his blood on Qtip.   PLAN: Referral  to ENT for further evaluation and management.  Dr. Anda Latina responded via Epic CHAT and recommends Ciprodex 4 drops bid for 7 days in left ear. He will see him tomorrow in the office.   Follow-up: No follow-ups on file.   This visit occurred during the SARS-CoV-2 public health emergency.  Safety protocols were in place, including screening questions prior to the visit, additional usage of staff PPE, and extensive cleaning of exam room while observing appropriate contact time as indicated for disinfecting solutions.   Denice Paradise, NP

## 2019-12-21 NOTE — Patient Instructions (Addendum)
Left ear has a small cratered ulcer noted inside ear opening in canal- likely source of his blood on Qtip.   PLAN:  Referral  to ENT for further evaluation and management. We have requested they work you in today. Please call us back if you have not been called for an appt.

## 2019-12-21 NOTE — Telephone Encounter (Signed)
Spoke with patient about his rx. Patient wanted to make sure he was getting the correct rx.

## 2019-12-22 ENCOUNTER — Other Ambulatory Visit: Payer: Self-pay

## 2019-12-22 DIAGNOSIS — I10 Essential (primary) hypertension: Secondary | ICD-10-CM

## 2019-12-22 MED ORDER — IRBESARTAN 300 MG PO TABS: 300.0000 mg | ORAL_TABLET | Freq: Every day | ORAL | 1 refills | Status: DC

## 2019-12-25 ENCOUNTER — Ambulatory Visit: Payer: 59 | Admitting: Cardiology

## 2020-01-27 ENCOUNTER — Other Ambulatory Visit: Payer: Self-pay

## 2020-01-27 ENCOUNTER — Encounter: Payer: Self-pay | Admitting: Family

## 2020-01-27 DIAGNOSIS — K625 Hemorrhage of anus and rectum: Secondary | ICD-10-CM

## 2020-01-27 MED ORDER — HYDROCORTISONE ACETATE 25 MG RE SUPP: 25.0000 mg | Freq: Two times a day (BID) | RECTAL | 0 refills | Status: AC

## 2020-02-03 ENCOUNTER — Other Ambulatory Visit: Payer: Self-pay

## 2020-02-03 ENCOUNTER — Encounter: Payer: Self-pay | Admitting: Family

## 2020-02-03 ENCOUNTER — Ambulatory Visit (INDEPENDENT_AMBULATORY_CARE_PROVIDER_SITE_OTHER): Payer: 59 | Admitting: Family

## 2020-02-03 ENCOUNTER — Telehealth: Payer: Self-pay

## 2020-02-03 VITALS — BP 110/76 | HR 86 | Temp 98.5°F | Ht 62.0 in | Wt 168.4 lb

## 2020-02-03 DIAGNOSIS — K625 Hemorrhage of anus and rectum: Secondary | ICD-10-CM | POA: Diagnosis not present

## 2020-02-03 DIAGNOSIS — I10 Essential (primary) hypertension: Secondary | ICD-10-CM | POA: Diagnosis not present

## 2020-02-03 LAB — BASIC METABOLIC PANEL
BUN: 12 mg/dL (ref 6–23)
CO2: 31 mEq/L (ref 19–32)
Calcium: 9.8 mg/dL (ref 8.4–10.5)
Chloride: 99 mEq/L (ref 96–112)
Creatinine, Ser: 0.96 mg/dL (ref 0.40–1.50)
GFR: 96.76 mL/min (ref >60.00)
Glucose, Bld: 100 mg/dL — ABNORMAL HIGH (ref 70–99)
Potassium: 4.2 mEq/L (ref 3.5–5.1)
Sodium: 136 mEq/L (ref 135–145)

## 2020-02-03 MED ORDER — HYDROCORTISONE (PERIANAL) 2.5 % EX CREA: 1.0000 "application " | TOPICAL_CREAM | Freq: Two times a day (BID) | CUTANEOUS | 0 refills | Status: DC

## 2020-02-03 NOTE — Patient Instructions (Signed)
I am working on an appointment for you with either gastroenterology or general surgery for hemorrhoid treatment.   Please call the office if you have not heard regarding this appointment in the next 2-3 days. If you develop worsening pain, fever, or concerns for infection ( purulent discharge), please go to urgent care.

## 2020-02-03 NOTE — Telephone Encounter (Signed)
Call pt We can try topical steroid cream- I have sent in  If he hasnt tried Tucks Pads which are witch hazel, he may try that as well

## 2020-02-03 NOTE — Telephone Encounter (Signed)
When patient was called in regards to appointment with [facility] tomorrow he thought something was going to be sent in for him today. He is hoping for some temporary relief since preparation H isn't helping much. Please advise?

## 2020-02-03 NOTE — Assessment & Plan Note (Addendum)
Symptoms and exam support prolapsed hemorrhoid. No evidence of perianal abscess. Unsure if internal prolapsed or external hemorrhoid. Due to size, failed conservative management, and discomfort for patient, I have placed referral to general surgery.

## 2020-02-03 NOTE — Addendum Note (Signed)
Addended by: Burnard Hawthorne on: 02/03/2020 04:38 PM   Modules accepted: Orders

## 2020-02-03 NOTE — Assessment & Plan Note (Signed)
Stable. Continue amlodipine 2.5mg  and irbesartan 300mg 

## 2020-02-03 NOTE — Progress Notes (Signed)
Subjective:    Patient ID: Alan Collier, male    DOB: 1975-04-16, 44 y.o.   MRN: [id number]  CC: Alan Collier is a 44 y.o. male who presents today for an acute visit.    HPI: CC of painful hemorrhoid. Over the past week has had increase in pain and hemorrhoid prior would return inside of rectum. This past week , it has remained outside of rectum and he has been sitting on soft pillow, doing sitz baths due to discomfort.  Endorses perianal itching. BRB when wiping after BM. Stool is brown in color. Stool is not diameter of pencil.  No purulent discharge from hemorhoid, abdominal pain, weight loss, fever, constipation. He has noticed if not eating enough fiber and 'stool is hard' , he will have pain with bowel movement, however has not required stool softeners.     Had been using anusol suppositories and not effective.   HTN- compliant with amlodipine 2.5mg  and irbesartan 300mg . No cp, sob.  No colon cancer in family.     Fecal occult negative 6 months ago HISTORY:  Past Medical History:  Diagnosis Date  . Hypertension    Past Surgical History:  Procedure Laterality Date  . STAPEDECTOMY Bilateral    Family History  Problem Relation Age of Onset  . Hypertension Mother   . Hypertension Maternal Grandfather   . Diabetes Maternal Grandfather   . Gastric cancer Maternal Grandfather   . Prostate cancer Neg Hx   . Colon cancer Neg Hx     Allergies: Iodine and Shellfish allergy Current Outpatient Medications on File Prior to Visit  Medication Sig Dispense Refill  . amLODipine (NORVASC) 2.5 MG tablet Take 1 tablet (2.5 mg total) by mouth daily. 30 tablet 5  . irbesartan (AVAPRO) 300 MG tablet Take 1 tablet (300 mg total) by mouth daily. 90 tablet 1  . hydrocortisone (ANUSOL-HC) 25 MG suppository Place 1 suppository (25 mg total) rectally 2 (two) times daily. (Patient not taking: Reported on 02/03/2020) 12 suppository 0   No current facility-administered medications on file  prior to visit.    Social History   Tobacco Use  . Smoking status: Never Smoker  . Smokeless tobacco: Never Used  Vaping Use  . Vaping Use: Never used  Substance Use Topics  . Alcohol use: Yes    Comment: 1-3 beers per week  . Drug use: Never    Review of Systems  Constitutional: Negative for chills, fever and unexpected weight change.  Respiratory: Negative for cough.   Cardiovascular: Negative for chest pain and palpitations.  Gastrointestinal: Positive for anal bleeding. Negative for abdominal distention, abdominal pain, blood in stool, constipation, nausea and vomiting.      Objective:    BP 110/76   Pulse 86   Temp 98.5 F (36.9 C)   Ht 5\' 2"  (1.575 m)   Wt 168 lb 6.4 oz (76.4 kg)   SpO2 98%   BMI 30.80 kg/m    Physical Exam Vitals reviewed.  Constitutional:      Appearance: He is well-developed and well-nourished.  Cardiovascular:     Rate and Rhythm: Regular rhythm.     Heart sounds: Normal heart sounds.  Pulmonary:     Effort: Pulmonary effort is normal. No respiratory distress.     Breath sounds: Normal breath sounds. No wheezing, rhonchi or rales.  Genitourinary:      Comments: Approx 9 oclock swollen erythematous hemorrhoid with BRB.Approx 3cm in diameter. Non thrombus palpated. Slightly tender to palpation. No  surrounding erythematous fluctuant bulge or perianal edema on exam  Skin:    General: Skin is warm and dry.  Neurological:     Mental Status: He is alert.  Psychiatric:        Mood and Affect: Mood and affect normal.        Speech: Speech normal.        Behavior: Behavior normal.        Assessment & Plan:   Problem List Items Addressed This Visit      Cardiovascular and Mediastinum   Hypertension - Primary    Stable. Continue amlodipine 2.5mg  and irbesartan 300mg       Relevant Orders   Basic metabolic panel     Digestive   Rectal bleeding    Symptoms and exam support prolapsed hemorrhoid. No evidence of perianal abscess.  Unsure if internal prolapsed or external hemorrhoid. Due to size, failed conservative management, and discomfort for patient, I have placed referral to general surgery.        Relevant Orders   Ambulatory referral to General Surgery        I am having [facility] maintain his amLODipine, irbesartan, and hydrocortisone.   No orders of the defined types were placed in this encounter.   Return precautions given.   Risks, benefits, and alternatives of the medications and treatment plan prescribed today were discussed, and patient expressed understanding.   Education regarding symptom management and diagnosis given to patient on AVS.  Continue to follow with [facility], FNP for routine health maintenance.   [facility] and I agreed with plan.   [facility], FNP

## 2020-02-03 NOTE — Telephone Encounter (Signed)
I called and advised patient that cream was sent & he also may try Tuck's pads for relief.

## 2020-02-04 ENCOUNTER — Ambulatory Visit (INDEPENDENT_AMBULATORY_CARE_PROVIDER_SITE_OTHER): Payer: 59 | Admitting: General Surgery

## 2020-02-04 ENCOUNTER — Encounter: Payer: Self-pay | Admitting: General Surgery

## 2020-02-04 VITALS — BP 135/85 | HR 82 | Temp 99.0°F | Ht 63.0 in | Wt 167.0 lb

## 2020-02-04 DIAGNOSIS — K645 Perianal venous thrombosis: Secondary | ICD-10-CM

## 2020-02-04 MED ORDER — LIDOCAINE (ANORECTAL) 5 % EX CREA: TOPICAL_CREAM | CUTANEOUS | 0 refills | Status: AC

## 2020-02-04 NOTE — Progress Notes (Signed)
Patient ID: Alan Collier, male   DOB: 10/31/1975, 44 y.o.   MRN: [id number]  Chief Complaint  Patient presents with  . Other     new patient urgent referral requested day Rectal bleeding referred by Alan Collier    HPI [name] is a 44 y.o. male.   He has been referred by his primary care provider, Alan Collier, for further evaluation of "rectal bleeding."  Alan Collier actually says that he is only seeing a small amount of blood on his toilet paper and thinks it is simply from his hemorrhoid.  It is a little bit challenging to get a clear history from him about prior issues with hemorrhoids, although he acknowledges having them for some time.  It sounds like in the past, he would notice a small bump or lump with defecation, but that it would retract back inside his anal canal or could be manually reduced. He reports that the current episode began last week.  He had a significant increase in pain and the hemorrhoid would no longer spontaneously or manually reduced.  He noticed some blood on his toilet tissue, but not any significant to the point that the toilet water looked bloody.  He was prescribed Anusol suppositories in the past, but says that these have not been particularly helpful.  He says Alan Collier prescribed Anusol cream as well, but he has not yet filled this prescription.  He has been using Preparation H and says that this has been more helpful than the Anusol.  He denies hard stool and is not taking stool softeners, but does have to strain or push vigorously to pass his bowel movements.  It does not appear that he has ever had a colonoscopy.   Past Medical History:  Diagnosis Date  . Hypertension     Past Surgical History:  Procedure Laterality Date  . STAPEDECTOMY Bilateral     Family History  Problem Relation Age of Onset  . Hypertension Mother   . Hypertension Maternal Grandfather   . Diabetes Maternal Grandfather   . Gastric cancer Maternal Grandfather   .  Prostate cancer Neg Hx   . Colon cancer Neg Hx     Social History Social History   Tobacco Use  . Smoking status: Never Smoker  . Smokeless tobacco: Never Used  Vaping Use  . Vaping Use: Never used  Substance Use Topics  . Alcohol use: Yes    Comment: 1-3 beers per week  . Drug use: Never    Allergies  Allergen Reactions  . Iodine     Hives and itching   . Shellfish Allergy     Hives and itching     Current Outpatient Medications  Medication Sig Dispense Refill  . amLODipine (NORVASC) 2.5 MG tablet Take 1 tablet (2.5 mg total) by mouth daily. 30 tablet 5  . irbesartan (AVAPRO) 300 MG tablet Take 1 tablet (300 mg total) by mouth daily. 90 tablet 1  . hydrocortisone (ANUSOL-HC) 2.5 % rectal cream Place 1 application rectally 2 (two) times daily. 30 g 0  . hydrocortisone (ANUSOL-HC) 25 MG suppository Place 1 suppository (25 mg total) rectally 2 (two) times daily. (Patient not taking: No sig reported) 12 suppository 0  . Lidocaine, Anorectal, 5 % CREA Apply thin layer to affected area every 6 hours as needed. 28 g 0   No current facility-administered medications for this visit.    Review of Systems Review of Systems  HENT: Positive for hearing loss and tinnitus.  Gastrointestinal: Positive for rectal pain.  Neurological: Positive for dizziness.  All other systems reviewed and are negative.   Blood pressure 135/85, pulse 82, temperature 99 F (37.2 C), temperature source Oral, height 5\' 3"  (1.6 m), weight 167 lb (75.8 kg), SpO2 98 %.  Physical Exam Physical Exam Vitals reviewed. Exam conducted with a chaperone present.  Constitutional:      General: He is not in acute distress.    Appearance: Normal appearance.  HENT:     Head: Normocephalic and atraumatic.     Nose:     Comments: Covered with a mask    Mouth/Throat:     Comments: Covered with a mask Eyes:     General: No scleral icterus.       Right eye: No discharge.        Left eye: No discharge.   Cardiovascular:     Rate and Rhythm: Normal rate and regular rhythm.  Pulmonary:     Effort: Pulmonary effort is normal.     Breath sounds: Normal breath sounds.  Abdominal:     General: Bowel sounds are normal.     Palpations: Abdomen is soft.  Genitourinary:    Rectum: External hemorrhoid present.       Comments: There is a thrombosed external hemorrhoid present on the right lateral column.  No stigmata of recent bleeding. Musculoskeletal:        General: No swelling or tenderness.     Cervical back: No rigidity.  Lymphadenopathy:     Cervical: No cervical adenopathy.  Skin:    General: Skin is warm and dry.  Neurological:     General: No focal deficit present.     Mental Status: He is alert and oriented to person, place, and time.  Psychiatric:        Mood and Affect: Mood normal.        Behavior: Behavior normal.     Data Reviewed I reviewed the clinic notes from yesterday from Alan Collier.  She did not appreciate any evidence of perianal abscess and I concur with that evaluation.  She also suggested use of Tucks pads, in addition to the topical steroid cream.  Assessment This is a 44 year old man with a thrombosed external hemorrhoid.  Unfortunately, due to the duration of his symptoms (it started last week), there is no indication for incision and drainage of the thrombus; this normally needs to be performed within a maximum of 72 hours, preferably within 48 hours for the patient to receive significant benefit.  Plan I discussed with Alan Collier that the course of action at this point is simply patience and watchful waiting, while his body works to break down the thrombus and reabsorb it.  He may continue to use Preparation H, perform sitz baths, and use the topical steroid cream as desired.  I have sent in a prescription for topical lidocaine to assist with pain management.  He should perform sitz baths 2-3 times daily and after every bowel movement.  I also recommended  that he use moist flushable wipes instead of regular toilet paper for hygiene to minimize trauma to the area.  He was reassured that his body will take care of this with time, but that it can take 3 to 6 weeks and he will likely be left with a small skin tag.  He was counseled to increase the fiber in his diet so that he does not have to push or strain at stool.  He was advised that,  in the future, should he have a similar onset of symptoms, he should contact our office immediately so that we can evaluate him and perform timely incision and drainage if he does have another thrombosed hemorrhoid.  For now, I will see him on an as-needed basis.    Fredirick Maudlin 02/04/2020, 1:36 PM

## 2020-02-04 NOTE — Patient Instructions (Addendum)
Dr.Cannon prescribed patient Lidocaine 5% cream to use along with Anusol cream as needed to help with pain. Patient may also try over the counter Tucks Pad to help prevent any pain or discomfort. Patient advised to give our office a call, if or when he feels any pain or concerns to the area. Patient advised to avoid constipation and to try to increase Fiber intake.   Hemorrhoids Hemorrhoids are swollen veins that may develop:  In the butt (rectum). These are called internal hemorrhoids.  Around the opening of the butt (anus). These are called external hemorrhoids. Hemorrhoids can cause pain, itching, or bleeding. Most of the time, they do not cause serious problems. They usually get better with diet changes, lifestyle changes, and other home treatments. What are the causes? This condition may be caused by:  Having trouble pooping (constipation).  Pushing hard (straining) to poop.  Watery poop (diarrhea).  Pregnancy.  Being very overweight (obese).  Sitting for long periods of time.  Heavy lifting or other activity that causes you to strain.  Anal sex.  Riding a bike for a long period of time. What are the signs or symptoms? Symptoms of this condition include:  Pain.  Itching or soreness in the butt.  Bleeding from the butt.  Leaking poop.  Swelling in the area.  One or more lumps around the opening of your butt. How is this diagnosed? A doctor can often diagnose this condition by looking at the affected area. The doctor may also:  Do an exam that involves feeling the area with a gloved hand (digital rectal exam).  Examine the area inside your butt using a small tube (anoscope).  Order blood tests. This may be done if you have lost a lot of blood.  Have you get a test that involves looking inside the colon using a flexible tube with a camera on the end (sigmoidoscopy or colonoscopy). How is this treated? This condition can usually be treated at home. Your  doctor may tell you to change what you eat, make lifestyle changes, or try home treatments. If these do not help, procedures can be done to remove the hemorrhoids or make them smaller. These may involve:  Placing rubber bands at the base of the hemorrhoids to cut off their blood supply.  Injecting medicine into the hemorrhoids to shrink them.  Shining a type of light energy onto the hemorrhoids to cause them to fall off.  Doing surgery to remove the hemorrhoids or cut off their blood supply. Follow these instructions at home: Eating and drinking   Eat foods that have a lot of fiber in them. These include whole grains, beans, nuts, fruits, and vegetables.  Ask your doctor about taking products that have added fiber (fibersupplements).  Reduce the amount of fat in your diet. You can do this by: ? Eating low-fat dairy products. ? Eating less red meat. ? Avoiding processed foods.  Drink enough fluid to keep your pee (urine) pale yellow. Managing pain and swelling   Take a warm-water bath (sitz bath) for 20 minutes to ease pain. Do this 3-4 times a day. You may do this in a bathtub or using a portable sitz bath that fits over the toilet.  If told, put ice on the painful area. It may be helpful to use ice between your warm baths. ? Put ice in a plastic bag. ? Place a towel between your skin and the bag. ? Leave the ice on for 20 minutes, 2-3 times a  day. General instructions  Take over-the-counter and prescription medicines only as told by your doctor. ? Medicated creams and medicines may be used as told.  Exercise often. Ask your doctor how much and what kind of exercise is best for you.  Go to the bathroom when you have the urge to poop. Do not wait.  Avoid pushing too hard when you poop.  Keep your butt dry and clean. Use wet toilet paper or moist towelettes after pooping.  Do not sit on the toilet for a long time.  Keep all follow-up visits as told by your doctor. This is  important. Contact a doctor if you:  Have pain and swelling that do not get better with treatment or medicine.  Have trouble pooping.  Cannot poop.  Have pain or swelling outside the area of the hemorrhoids. Get help right away if you have:  Bleeding that will not stop. Summary  Hemorrhoids are swollen veins in the butt or around the opening of the butt.  They can cause pain, itching, or bleeding.  Eat foods that have a lot of fiber in them. These include whole grains, beans, nuts, fruits, and vegetables.  Take a warm-water bath (sitz bath) for 20 minutes to ease pain. Do this 3-4 times a day. This information is not intended to replace advice given to you by your health care provider. Make sure you discuss any questions you have with your health care provider. Document Revised: 02/06/2018 Document Reviewed: 06/20/2017 Elsevier Patient Education  [place].

## 2020-02-15 ENCOUNTER — Telehealth: Payer: Self-pay | Admitting: Cardiology

## 2020-02-15 NOTE — Telephone Encounter (Signed)
-----   Message from Andi Devon sent at 02/15/2020 11:13 AM EST ----- Regarding: appointment Please schedule overdue F/U appointment. Thank you!

## 2020-02-15 NOTE — Telephone Encounter (Signed)
Patient declined ct and fu at this time. He has concerns about ct and being allergic to iodine.   Patient will call when he is ready .

## 2020-02-17 ENCOUNTER — Other Ambulatory Visit: Payer: Self-pay | Admitting: *Deleted

## 2020-02-17 DIAGNOSIS — I10 Essential (primary) hypertension: Secondary | ICD-10-CM

## 2020-02-17 MED ORDER — AMLODIPINE BESYLATE 2.5 MG PO TABS: 2.5000 mg | ORAL_TABLET | Freq: Every day | ORAL | 0 refills | Status: DC

## 2020-02-17 NOTE — Telephone Encounter (Signed)
90 day supply with 0 refills sent to pharmacy with notation for patient to call office for an appointment for further refills.

## 2020-02-24 ENCOUNTER — Ambulatory Visit: Payer: 59 | Admitting: Family

## 2020-04-22 ENCOUNTER — Encounter: Payer: Self-pay | Admitting: Family

## 2020-04-22 ENCOUNTER — Ambulatory Visit (INDEPENDENT_AMBULATORY_CARE_PROVIDER_SITE_OTHER): Payer: 59 | Admitting: Family

## 2020-04-22 ENCOUNTER — Other Ambulatory Visit: Payer: Self-pay

## 2020-04-22 DIAGNOSIS — I10 Essential (primary) hypertension: Secondary | ICD-10-CM

## 2020-04-22 DIAGNOSIS — K625 Hemorrhage of anus and rectum: Secondary | ICD-10-CM

## 2020-04-22 NOTE — Patient Instructions (Signed)
Trial stop of amlodipine 2.5mg  for the next 2 weeks and see if dizziness improves Drink plenty of water  Ensure you are monitoring blood pressure during this time as goal is less than 130/80.

## 2020-04-22 NOTE — Assessment & Plan Note (Signed)
Controlled today without amlodipine. 2.5mg . He is not orthostatic ( see flow sheet). Advised trial stop amlodipine to see if dizziness resolves. Counseled on being careful with position change. Continue irbesartan 300mg 

## 2020-04-22 NOTE — Progress Notes (Signed)
Subjective:    Patient ID: Alan Collier, male    DOB: 11-Nov-1975, 45 y.o.   MRN: [id number]  CC: Alan Collier is a 45 y.o. male who presents today for follow up.   HPI: Complains of dizziness with position changes the past 3 days, comes and goes. He works under a piece of equipment and feels dizzy when he gets up quickly.   Has been drinking more water with some relief.  Symptom is not associated with exertional chest pain or pressure, numbness or tingling radiating to left arm or jaw, palpitations,syncope, frequent headaches, changes in vision, or shortness of breath.    He forgot to take amlodipine 2.5mg   today. He is compliant with irbesartan 300mg  qpm Hemorrhoid has gone away and he only has remaining 'skin tag'. He has increased fiber.   He had echocardiogram with [name] and declined having Ct chest cardiac.  HISTORY:  Past Medical History:  Diagnosis Date  . Hypertension    Past Surgical History:  Procedure Laterality Date  . STAPEDECTOMY Bilateral    Family History  Problem Relation Age of Onset  . Hypertension Mother   . Hypertension Maternal Grandfather   . Diabetes Maternal Grandfather   . Gastric cancer Maternal Grandfather   . Prostate cancer Neg Hx   . Colon cancer Neg Hx     Allergies: Iodine and Shellfish allergy Current Outpatient Medications on File Prior to Visit  Medication Sig Dispense Refill  . amLODipine (NORVASC) 2.5 MG tablet Take 1 tablet (2.5 mg total) by mouth daily. PLEASE CALL OFFICE TO SCHEDULE APPOINTMENT FOR FURTHER REFILLS. 90 tablet 0  . irbesartan (AVAPRO) 300 MG tablet Take 1 tablet (300 mg total) by mouth daily. 90 tablet 1  . hydrocortisone (ANUSOL-HC) 2.5 % rectal cream Place 1 application rectally 2 (two) times daily. (Patient not taking: Reported on 04/22/2020) 30 g 0  . hydrocortisone (ANUSOL-HC) 25 MG suppository Place 1 suppository (25 mg total) rectally 2 (two) times daily. (Patient not taking: Reported on 04/22/2020) 12  suppository 0  . Lidocaine, Anorectal, 5 % CREA Apply thin layer to affected area every 6 hours as needed. (Patient not taking: Reported on 04/22/2020) 28 g 0   No current facility-administered medications on file prior to visit.    Social History   Tobacco Use  . Smoking status: Never Smoker  . Smokeless tobacco: Never Used  Vaping Use  . Vaping Use: Never used  Substance Use Topics  . Alcohol use: Yes    Comment: 1-3 beers per week  . Drug use: Never    Review of Systems  Constitutional: Negative for chills and fever.  Respiratory: Negative for cough and shortness of breath.   Cardiovascular: Negative for chest pain and palpitations.  Gastrointestinal: Negative for nausea and vomiting.  Neurological: Positive for dizziness and light-headedness. Negative for syncope.      Objective:    BP 122/82   Pulse 81   Temp 98.2 F (36.8 C)   Ht 5\' 3"  (1.6 m)   Wt 171 lb 3.2 oz (77.7 kg)   SpO2 99%   BMI 30.33 kg/m  BP Readings from Last 3 Encounters:  04/22/20 122/82  02/04/20 135/85  02/03/20 110/76   Wt Readings from Last 3 Encounters:  04/22/20 171 lb 3.2 oz (77.7 kg)  02/04/20 167 lb (75.8 kg)  02/03/20 168 lb 6.4 oz (76.4 kg)    Physical Exam Vitals reviewed.  Constitutional:      Appearance: He is well-developed.  Cardiovascular:     Rate and Rhythm: Regular rhythm.     Heart sounds: Normal heart sounds.  Pulmonary:     Effort: Pulmonary effort is normal. No respiratory distress.     Breath sounds: Normal breath sounds. No wheezing, rhonchi or rales.  Skin:    General: Skin is warm and dry.  Neurological:     Mental Status: He is alert.  Psychiatric:        Speech: Speech normal.        Behavior: Behavior normal.        Assessment & Plan:   Problem List Items Addressed This Visit      Cardiovascular and Mediastinum   Hypertension    Controlled today without amlodipine. 2.5mg . He is not orthostatic ( see flow sheet). Advised trial stop amlodipine  to see if dizziness resolves. Counseled on being careful with position change. Continue irbesartan 300mg         Digestive   Rectal bleeding    Resolved.           I am having [place] maintain his irbesartan, hydrocortisone, hydrocortisone, Lidocaine (Anorectal), and amLODipine.   No orders of the defined types were placed in this encounter.   Return precautions given.   Risks, benefits, and alternatives of the medications and treatment plan prescribed today were discussed, and patient expressed understanding.   Education regarding symptom management and diagnosis given to patient on AVS.  Continue to follow with Burnard [facility]thorne, FNP for routine health maintenance.   [facility] and I agreed with plan.   [name], FNP

## 2020-04-22 NOTE — Assessment & Plan Note (Signed)
Resolved

## 2020-05-11 ENCOUNTER — Other Ambulatory Visit: Payer: Self-pay | Admitting: *Deleted

## 2020-05-11 DIAGNOSIS — I10 Essential (primary) hypertension: Secondary | ICD-10-CM

## 2020-05-11 NOTE — Telephone Encounter (Signed)
LVM for patient to schedule.

## 2020-05-12 MED ORDER — AMLODIPINE BESYLATE 2.5 MG PO TABS: 2.5000 mg | ORAL_TABLET | Freq: Every day | ORAL | 0 refills | Status: DC

## 2020-05-12 NOTE — Telephone Encounter (Signed)
LMV for patient to schedule

## 2020-05-12 NOTE — Telephone Encounter (Signed)
Scheduled 4/7 with Gilford Rile

## 2020-05-19 ENCOUNTER — Other Ambulatory Visit: Payer: Self-pay

## 2020-05-19 ENCOUNTER — Encounter: Payer: Self-pay | Admitting: Family

## 2020-05-19 ENCOUNTER — Ambulatory Visit (INDEPENDENT_AMBULATORY_CARE_PROVIDER_SITE_OTHER): Payer: 59 | Admitting: Family

## 2020-05-19 VITALS — BP 120/78 | HR 74 | Ht 63.0 in | Wt 172.0 lb

## 2020-05-19 DIAGNOSIS — R079 Chest pain, unspecified: Secondary | ICD-10-CM

## 2020-05-19 DIAGNOSIS — K219 Gastro-esophageal reflux disease without esophagitis: Secondary | ICD-10-CM

## 2020-05-19 DIAGNOSIS — I10 Essential (primary) hypertension: Secondary | ICD-10-CM | POA: Diagnosis not present

## 2020-05-19 MED ORDER — OMEPRAZOLE 20 MG PO CPDR: 20.0000 mg | DELAYED_RELEASE_CAPSULE | Freq: Every day | ORAL | 3 refills | Status: DC

## 2020-05-19 NOTE — Patient Instructions (Addendum)
Medication Instructions:  Your physician has recommended you make the following change in your medication:   START Omeprazole 20mg  daily for acid reflux and indigestion  For breakthrough symptoms, you may use Famotidine (Pepcid) or Tums.   Can get any of these meds over the counter  *If you need a refill on your cardiac medications before your next appointment, please call your pharmacy*  Lab Work: None ordered today  Testing/Procedures: If you would like to have a coronary CTA, please let us know. We will consider if you have more episodes of chest pain that are unrelieved by your Omeprazole.  Your EKG today showed normal sinus rhythm which is a good result!  Follow-Up: At [facility], you and your health needs are our priority.  As part of our continuing mission to provide you with exceptional heart care, we have created designated Provider Care Teams.  These Care Teams include your primary Cardiologist (physician) and Advanced Practice Providers (APPs -  Physician Assistants and Nurse Practitioners) who all work together to provide you with the care you need, when you need it.  We recommend signing up for the patient portal called "MyChart".  Sign up information is provided on this After Visit Summary.  MyChart is used to connect with patients for Virtual Visits (Telemedicine).  Patients are able to view lab/test results, encounter notes, upcoming appointments, etc.  Non-urgent messages can be sent to your provider as well.   To learn more about what you can do with MyChart, go to [email or web address].    Your next appointment:   6 month(s)  The format for your next appointment:   In Person  Provider:   You may see Kate Sable, MD or one of the following Advanced Practice Providers on your designated Care Team:    Murray Hodgkins, NP  Christell Faith, PA-C  Marrianne Mood, PA-C  Cadence Kathlen Mody, [place]  Laurann Montana, NP  Other Instructions  Heart Healthy Diet  Recommendations: A low-salt diet is recommended. Meats should be grilled, baked, or boiled. Avoid fried foods. Focus on lean protein sources like fish or chicken with vegetables and fruits. The American Heart Association is a Microbiologist!  American Heart Association Diet and Lifeystyle Recommendations   Exercise recommendations: The American Heart Association recommends 150 minutes of moderate intensity exercise weekly. Try 30 minutes of moderate intensity exercise 4-5 times per week. This could include walking, jogging, or swimming.   Food Choices for Gastroesophageal Reflux Disease, Adult When you have gastroesophageal reflux disease (GERD), the foods you eat and your eating habits are very important. Choosing the right foods can help ease your discomfort. Think about working with a food expert (dietitian) to help you make good choices. What are tips for following this plan? Reading food labels  Look for foods that are low in saturated fat. Foods that may help with your symptoms include: ? Foods that have less than 5% of daily value (DV) of fat. ? Foods that have 0 grams of trans fat. Cooking  Do not fry your food.  Cook your food by baking, steaming, grilling, or broiling. These are all methods that do not need a lot of fat for cooking.  To add flavor, try to use herbs that are low in spice and acidity. Meal planning  Choose healthy foods that are low in fat, such as: ? Fruits and vegetables. ? Whole grains. ? Low-fat dairy products. ? Lean meats, fish, and poultry.  Eat small meals often instead of eating 3 large  meals each day. Eat your meals slowly in a place where you are relaxed. Avoid bending over or lying down until 2-3 hours after eating.  Limit high-fat foods such as fatty meats or fried foods.  Limit your intake of fatty foods, such as oils, butter, and shortening.  Avoid the following as told by your doctor: ? Foods that cause symptoms. These may be different  for different people. Keep a food diary to keep track of foods that cause symptoms. ? Alcohol. ? Drinking a lot of liquid with meals. ? Eating meals during the 2-3 hours before bed.   Lifestyle  Stay at a healthy weight. Ask your doctor what weight is healthy for you. If you need to lose weight, work with your doctor to do so safely.  Exercise for at least 30 minutes on 5 or more days each week, or as told by your doctor.  Wear loose-fitting clothes.  Do not smoke or use any products that contain nicotine or tobacco. If you need help quitting, ask your doctor.  Sleep with the head of your bed higher than your feet. Use a wedge under the mattress or blocks under the bed frame to raise the head of the bed.  Chew sugar-free gum after meals. What foods should eat? Eat a healthy, well-balanced diet of fruits, vegetables, whole grains, low-fat dairy products, lean meats, fish, and poultry. Each person is different. Foods that may cause symptoms in one person may not cause any symptoms in another person. Work with your doctor to find foods that are safe for you. The items listed above may not be a complete list of what you can eat and drink. Contact a food expert for more options.   What foods should I avoid? Limiting some of these foods may help in managing the symptoms of GERD. Everyone is different. Talk with a food expert or your doctor to help you find the exact foods to avoid, if any. Fruits Any fruits prepared with added fat. Any fruits that cause symptoms. For some people, this may include citrus fruits, such as oranges, grapefruit, pineapple, and lemons. Vegetables Deep-fried vegetables. [place] fries. Any vegetables prepared with added fat. Any vegetables that cause symptoms. For some people, this may include tomatoes and tomato products, chili peppers, onions and garlic, and horseradish. Grains Pastries or quick breads with added fat. Meats and other proteins High-fat meats, such as  fatty beef or pork, hot dogs, ribs, ham, sausage, salami, and bacon. Fried meat or protein, including fried fish and fried chicken. Nuts and nut butters, in large amounts. Dairy Whole milk and chocolate milk. Sour cream. Cream. Ice cream. Cream cheese. Milkshakes. Fats and oils Butter. Margarine. Shortening. Ghee. Beverages Coffee and tea, with or without caffeine. Carbonated beverages. Sodas. Energy drinks. Fruit juice made with acidic fruits, such as orange or grapefruit. Tomato juice. Alcoholic drinks. Sweets and desserts Chocolate and cocoa. Donuts. Seasonings and condiments Pepper. Peppermint and spearmint. Added salt. Any condiments, herbs, or seasonings that cause symptoms. For some people, this may include curry, hot sauce, or vinegar-based salad dressings. The items listed above may not be a complete list of what you should not eat and drink. Contact a food expert for more options. Questions to ask your doctor Diet and lifestyle changes are often the first steps that are taken to manage symptoms of GERD. If diet and lifestyle changes do not help, talk with your doctor about taking medicines. Where to find more information  [employer] for Gastrointestinal  Disorders: aboutgerd.org Summary  When you have GERD, food and lifestyle choices are very important in easing your symptoms.  Eat small meals often instead of 3 large meals a day. Eat your meals slowly and in a place where you are relaxed.  Avoid bending over or lying down until 2-3 hours after eating.  Limit high-fat foods such as fatty meats or fried foods. This information is not intended to replace advice given to you by your health care provider. Make sure you discuss any questions you have with your health care provider. Document Revised: 08/10/2019 Document Reviewed: 08/10/2019 Elsevier Patient Education  [place].

## 2020-05-19 NOTE — Progress Notes (Signed)
Office Visit    Patient Name: Alan Collier Date of Encounter: 05/19/2020  PCP:  Burnard Hawthorne, [place]  Cardiologist:  Kate Sable, MD  Advanced Practice Provider:  No care team member to display Electrophysiologist:  None   Chief Complaint    Alan Collier is a 45 y.o. male with a hx of hypertension, chest pain presents today for follow-up of chest pain  Past Medical History    Past Medical History:  Diagnosis Date  . Hypertension    Past Surgical History:  Procedure Laterality Date  . STAPEDECTOMY Bilateral     Allergies  Allergies  Allergen Reactions  . Iodine     Hives and itching   . Shellfish Allergy     Hives and itching     History of Present Illness    Alan Collier is a 45 y.o. male with a hx of hypertension, chest pain last seen 11/27/2019 by Dr. Garen Lah.  He was seen in consult 09/2019 due to chest pain and recommended for echocardiogram and coronary CTA.  Echocardiogram performed 10/29/2019 showed LVEF 6075%, no R WMA, normal diastolic parameters, RV normal size and function, no significant valvular abnormalities..  He declined coronary CTA due to concern regarding iodine and shellfish allergy.  At follow-up 11/2019 he noted continued on and off chest pain was recommended to complete coronary CTA with premedications including Benadryl and prednisone and have the test performed at [facility] rather than outpatient imaging center.  After his visit he called the office to make [place] aware that he was in only to proceed with coronary CTA at that time.  He was seen by his primary care provider 3 supplements 22 noting dizziness.  Orthostatic vital signs were negative.  His irbesartan 300 mg daily was continued and he was advised a trial of stopping amlodipine.  Presents today for follow-up of chest pain. Tells me took some omeprazole over the counter and his previous chest discomfort improved. He noted chest  discomfort was triggered by his morning coffee and has since stopped drinking coffee. Discomfort has been on his both left and right side of his chest and is non exertional. Tells me it will feel like a muscle soreness. He has no formal exercise routine though does try to stay busy with his 36 year old son. Reports no shortness of breath nor dyspnea on exertion. No edema, orthopnea, PND. Reports no palpitations. Reports resolution of dizziness since discontinuation of Amlodipine.   EKGs/Labs/Other Studies Reviewed:   The following studies were reviewed today: Echo 10/29/2019 1. Left ventricular ejection fraction, by estimation, is 60 to 65%. The  left ventricle has normal function. The left ventricle has no regional  wall motion abnormalities. Left ventricular diastolic parameters were  normal. The average left ventricular  global longitudinal strain is -15.1 %.   2. Right ventricular systolic function is normal. The right ventricular  size is normal.   3. The mitral valve is normal in structure. No evidence of mitral valve  regurgitation. No evidence of mitral stenosis.   4. The aortic valve is normal in structure. Aortic valve regurgitation is  not visualized. No aortic stenosis is present.   5. The inferior vena cava is normal in size with greater than 50%  respiratory variability, suggesting right atrial pressure of 3 mmHg.   EKG:  EKG is ordered today.  The ekg ordered today demonstrates NSR 74 bpm with no acute ST/T wave changes.   Recent  Labs: 07/10/2019: ALT 30; Hemoglobin 15.8; Platelets 256.0; TSH 1.44 02/03/2020: BUN 12; Creatinine, Ser 0.96; Potassium 4.2; Sodium 136  Recent Lipid Panel    Component Value Date/Time   CHOL 152 07/10/2019 1121   TRIG 167.0 (H) 07/10/2019 1121   HDL 40.40 07/10/2019 1121   CHOLHDL 4 07/10/2019 1121   VLDL 33.4 07/10/2019 1121   LDLCALC 78 07/10/2019 1121   LDLDIRECT 76.0 06/16/2018 0932   Home Medications   Current Meds  Medication Sig   . hydrocortisone (ANUSOL-HC) 25 MG suppository Place 1 suppository (25 mg total) rectally 2 (two) times daily.  . irbesartan (AVAPRO) 300 MG tablet Take 1 tablet (300 mg total) by mouth daily.  . Lidocaine, Anorectal, 5 % CREA Apply thin layer to affected area every 6 hours as needed.     Review of Systems  All other systems reviewed and are otherwise negative except as noted above.  Physical Exam    VS:  BP 120/78 (BP Location: Left Arm, Patient Position: Sitting, Cuff Size: Normal)   Pulse 74   Ht 5\' 3"  (1.6 m)   Wt 172 lb (78 kg)   SpO2 98%   BMI 30.47 kg/m  , BMI Body mass index is 30.47 kg/m.  Wt Readings from Last 3 Encounters:  05/19/20 172 lb (78 kg)  04/22/20 171 lb 3.2 oz (77.7 kg)  02/04/20 167 lb (75.8 kg)   GEN: Well nourished, well developed, in no acute distress. HEENT: normal. Neck: Supple, no JVD, carotid bruits, or masses. Cardiac: RRR, no murmurs, rubs, or gallops. No clubbing, cyanosis, edema.  Radials/PT 2+ and equal bilaterally.  Respiratory:  Respirations regular and unlabored, clear to auscultation bilaterally. GI: Soft, nontender, nondistended. MS: No deformity or atrophy. Skin: Warm and dry, no rash. Neuro:  Strength and sensation are intact. Psych: Normal affect.  Assessment & Plan    1. Atypical chest pain / GERD- Reports this has resolved with Omeprazole. Will Rx Omeprazole 20mg  daily.  Reports only chest discomfort which feels like indigestion and improves with Omeprazole. No exertional chest discomfort. EKG today normal sinus rhythm with no acute ST/T wave changes. No indication for ischemic evaluation at this time as symptoms are likely related to GERD. If he has new or different chest pain, we will consider coronary CTA. He has previously been hesitant due to shellfish/iodine allergy. He does question whether galbladder is contributory, liver function 06/2019 normal and symptoms are not immediately after a meal. Encouraged to continue Omeprazole  and discuss with PCP at upcoming follow up.   2. Hypertension- BP well controlled. Continue current antihypertensive regimen including Irbesartan 300mg  daily. Previous dizziness on Amlodipine which has resolved.   Disposition: Follow up in 6 month(s) with Dr. Garen Lah or APP   Signed, Loel Dubonnet, NP 05/19/2020, 11:16 AM [place]

## 2020-05-23 ENCOUNTER — Telehealth: Payer: Self-pay | Admitting: Family

## 2020-05-23 NOTE — Telephone Encounter (Signed)
-----   Message from Loel Dubonnet, NP sent at 05/19/2020 11:44 AM EDT ----- FYI BP at goal and dizziness resolved off Amlodipine. Chest pain resolved with Omeprazole. Sent him in an Rx in case it was cheaper with insurance. He asked about galbladder, symptoms atypical, told him to take Omeprazole as directed and could consider liver enzymes at next visit with you if indicated based on symptoms.

## 2020-05-23 NOTE — Telephone Encounter (Signed)
LMTCB

## 2020-05-23 NOTE — Telephone Encounter (Signed)
Call pt  He discussed gallbladder concerns in regard to chest pain,   If he would like to dicuss, please advise sooner appt

## 2020-05-23 NOTE — Telephone Encounter (Signed)
Patient returned Sarah's phone call.

## 2020-05-23 NOTE — Telephone Encounter (Signed)
Pt called back wanting to talk to [place]

## 2020-05-24 NOTE — Telephone Encounter (Signed)
Pt stated that he had no acute concerns for gallbladder. He will discuss at visit with you in June. He just didn't know if that was something that he may face in future & if there were labs that could be checked to see if gallbladder was functioning properly. He feels that since brother as well as his mom had theirs removed that he may face this in future as well. He said that omeprazole prescribed to him has been helping.

## 2020-05-26 ENCOUNTER — Other Ambulatory Visit: Payer: Self-pay | Admitting: Family

## 2020-05-26 DIAGNOSIS — I10 Essential (primary) hypertension: Secondary | ICD-10-CM

## 2020-07-27 ENCOUNTER — Ambulatory Visit: Payer: 59 | Admitting: Family

## 2020-08-01 ENCOUNTER — Ambulatory Visit: Payer: 59 | Admitting: Family

## 2020-08-08 ENCOUNTER — Ambulatory Visit (INDEPENDENT_AMBULATORY_CARE_PROVIDER_SITE_OTHER): Payer: 59 | Admitting: Family

## 2020-08-08 ENCOUNTER — Other Ambulatory Visit: Payer: Self-pay

## 2020-08-08 ENCOUNTER — Encounter: Payer: Self-pay | Admitting: Family

## 2020-08-08 DIAGNOSIS — R3911 Hesitancy of micturition: Secondary | ICD-10-CM | POA: Diagnosis not present

## 2020-08-08 DIAGNOSIS — I10 Essential (primary) hypertension: Secondary | ICD-10-CM

## 2020-08-08 LAB — URINALYSIS, ROUTINE W REFLEX MICROSCOPIC
Bilirubin Urine: NEGATIVE
Hgb urine dipstick: NEGATIVE
Ketones, ur: NEGATIVE
Leukocytes,Ua: NEGATIVE
Nitrite: NEGATIVE
RBC / HPF: NONE SEEN (ref 0–?)
Specific Gravity, Urine: <=1.005 — AB (ref 1.000–1.030)
Total Protein, Urine: NEGATIVE
Urine Glucose: NEGATIVE
Urobilinogen, UA: 0.2 (ref 0.0–1.0)
WBC, UA: NONE SEEN (ref 0–?)
pH: 6 (ref 5.0–8.0)

## 2020-08-08 NOTE — Assessment & Plan Note (Addendum)
Chronic , stable. Advised follow up with urology due to ongoing symptoms. Declines seeing dr Bernardo Heater again at this time. Pending UA, PSA for surveillance.

## 2020-08-08 NOTE — Assessment & Plan Note (Signed)
Controlled. Continue irbesartan 300mg .

## 2020-08-08 NOTE — Progress Notes (Signed)
Subjective:    Patient ID: Alan Collier, male    DOB: 1975/02/26, 45 y.o.   MRN: [id number]  CC: Alan Collier is a 45 y.o. male who presents today for follow up.   HPI: Feels well today No complaints  HTN- compliant with irbesartan 300mg . At home 130/80 prior to taking blood pressure.   Continues to have occassional urinary hesitancy, decreased urine. This is not worse. He saw Dr Bernardo Heater 08/12/19 for symptoms.     HISTORY:  Past Medical History:  Diagnosis Date   Hypertension    Past Surgical History:  Procedure Laterality Date   STAPEDECTOMY Bilateral    Family History  Problem Relation Age of Onset   Hypertension Mother    Hypertension Maternal Grandfather    Diabetes Maternal Grandfather    Gastric cancer Maternal Grandfather    Prostate cancer Neg Hx    Colon cancer Neg Hx     Allergies: Iodine and Shellfish allergy Current Outpatient Medications on File Prior to Visit  Medication Sig Dispense Refill   irbesartan (AVAPRO) 300 MG tablet TAKE 1 TABLET BY MOUTH  DAILY 90 tablet 3   omeprazole (PRILOSEC) 20 MG capsule Take 1 capsule (20 mg total) by mouth daily. 90 capsule 3   hydrocortisone (ANUSOL-HC) 25 MG suppository Place 1 suppository (25 mg total) rectally 2 (two) times daily. (Patient not taking: Reported on 08/08/2020) 12 suppository 0   Lidocaine, Anorectal, 5 % CREA Apply thin layer to affected area every 6 hours as needed. (Patient not taking: Reported on 08/08/2020) 28 g 0   No current facility-administered medications on file prior to visit.    Social History   Tobacco Use   Smoking status: Never   Smokeless tobacco: Never  Vaping Use   Vaping Use: Never used  Substance Use Topics   Alcohol use: Yes    Comment: 1-3 beers per week   Drug use: Never    Review of Systems  Constitutional:  Negative for chills and fever.  Respiratory:  Negative for cough.   Cardiovascular:  Negative for chest pain and palpitations.  Gastrointestinal:  Negative  for nausea and vomiting.  Genitourinary:  Positive for difficulty urinating.     Objective:    BP 112/66 (BP Location: Left Arm, Patient Position: Sitting, Cuff Size: Large)   Pulse 79   Temp 98 F (36.7 C) (Oral)   Ht 5\' 3"  (1.6 m)   Wt 177 lb 6.4 oz (80.5 kg)   SpO2 98%   BMI 31.42 kg/m  BP Readings from Last 3 Encounters:  08/08/20 112/66  05/19/20 120/78  04/22/20 122/82   Wt Readings from Last 3 Encounters:  08/08/20 177 lb 6.4 oz (80.5 kg)  05/19/20 172 lb (78 kg)  04/22/20 171 lb 3.2 oz (77.7 kg)    Physical Exam Vitals reviewed.  Constitutional:      Appearance: He is well-developed.  Cardiovascular:     Rate and Rhythm: Regular rhythm.     Heart sounds: Normal heart sounds.  Pulmonary:     Effort: Pulmonary effort is normal. No respiratory distress.     Breath sounds: Normal breath sounds. No wheezing, rhonchi or rales.  Skin:    General: Skin is warm and dry.  Neurological:     Mental Status: He is alert.  Psychiatric:        Speech: Speech normal.        Behavior: Behavior normal.       Assessment & Plan:   Problem  List Items Addressed This Visit       Cardiovascular and Mediastinum   Hypertension    Controlled. Continue irbesartan 300mg .        Relevant Orders   Comprehensive metabolic panel   Lipid panel     Other   Urinary hesitancy    Chronic , stable. Advised follow up with urology due to ongoing symptoms. Declines seeing dr Bernardo Heater again at this time. Pending UA, PSA for surveillance.        Relevant Orders   PSA   Urinalysis, Routine w reflex microscopic (Completed)     I am having [place] maintain his hydrocortisone, Lidocaine (Anorectal), omeprazole, and irbesartan.   No orders of the defined types were placed in this encounter.   Return precautions given.   Risks, benefits, and alternatives of the medications and treatment plan prescribed today were discussed, and patient expressed understanding.   Education  regarding symptom management and diagnosis given to patient on AVS.  Continue to follow with Burnard Hawthorne, FNP for routine health maintenance.   [facility] and I agreed with plan.   [name], FNP

## 2020-08-08 NOTE — Patient Instructions (Signed)
Nice to see you!   

## 2020-08-12 NOTE — Addendum Note (Signed)
Addended by: Leeanne Rio on: 08/12/2020 04:16 PM   Modules accepted: Orders

## 2020-08-18 ENCOUNTER — Other Ambulatory Visit: Payer: Self-pay

## 2020-08-18 ENCOUNTER — Other Ambulatory Visit (INDEPENDENT_AMBULATORY_CARE_PROVIDER_SITE_OTHER): Payer: 59

## 2020-08-18 DIAGNOSIS — I10 Essential (primary) hypertension: Secondary | ICD-10-CM | POA: Diagnosis not present

## 2020-08-18 DIAGNOSIS — R3911 Hesitancy of micturition: Secondary | ICD-10-CM

## 2020-08-18 LAB — COMPREHENSIVE METABOLIC PANEL
ALT: 37 U/L (ref 0–53)
AST: 18 U/L (ref 0–37)
Albumin: 4.5 g/dL (ref 3.5–5.2)
Alkaline Phosphatase: 48 U/L (ref 39–117)
BUN: 12 mg/dL (ref 6–23)
CO2: 27 mEq/L (ref 19–32)
Calcium: 9.8 mg/dL (ref 8.4–10.5)
Chloride: 103 mEq/L (ref 96–112)
Creatinine, Ser: 1.02 mg/dL (ref 0.40–1.50)
GFR: 89.63 mL/min (ref >60.00)
Glucose, Bld: 93 mg/dL (ref 70–99)
Potassium: 4.5 mEq/L (ref 3.5–5.1)
Sodium: 138 mEq/L (ref 135–145)
Total Bilirubin: 1.1 mg/dL (ref 0.2–1.2)
Total Protein: 7 g/dL (ref 6.0–8.3)

## 2020-08-18 LAB — LIPID PANEL
Cholesterol: 164 mg/dL (ref 0–200)
HDL: 34.2 mg/dL — ABNORMAL LOW (ref >39.00)
NonHDL: 129.41
Total CHOL/HDL Ratio: 5
Triglycerides: 297 mg/dL — ABNORMAL HIGH (ref 0.0–149.0)
VLDL: 59.4 mg/dL — ABNORMAL HIGH (ref 0.0–40.0)

## 2020-08-18 LAB — PSA: PSA: 0.62 ng/mL (ref 0.10–4.00)

## 2020-08-18 LAB — LDL CHOLESTEROL, DIRECT: Direct LDL: 93 mg/dL

## 2020-09-14 IMAGING — DX CHEST - 2 VIEW
2 series · 2 of 2 positions shown · non-contrast
Comparison: None.

CLINICAL DATA: Chest pain for several weeks.

EXAM:
CHEST - 2 VIEW

[chest pa]
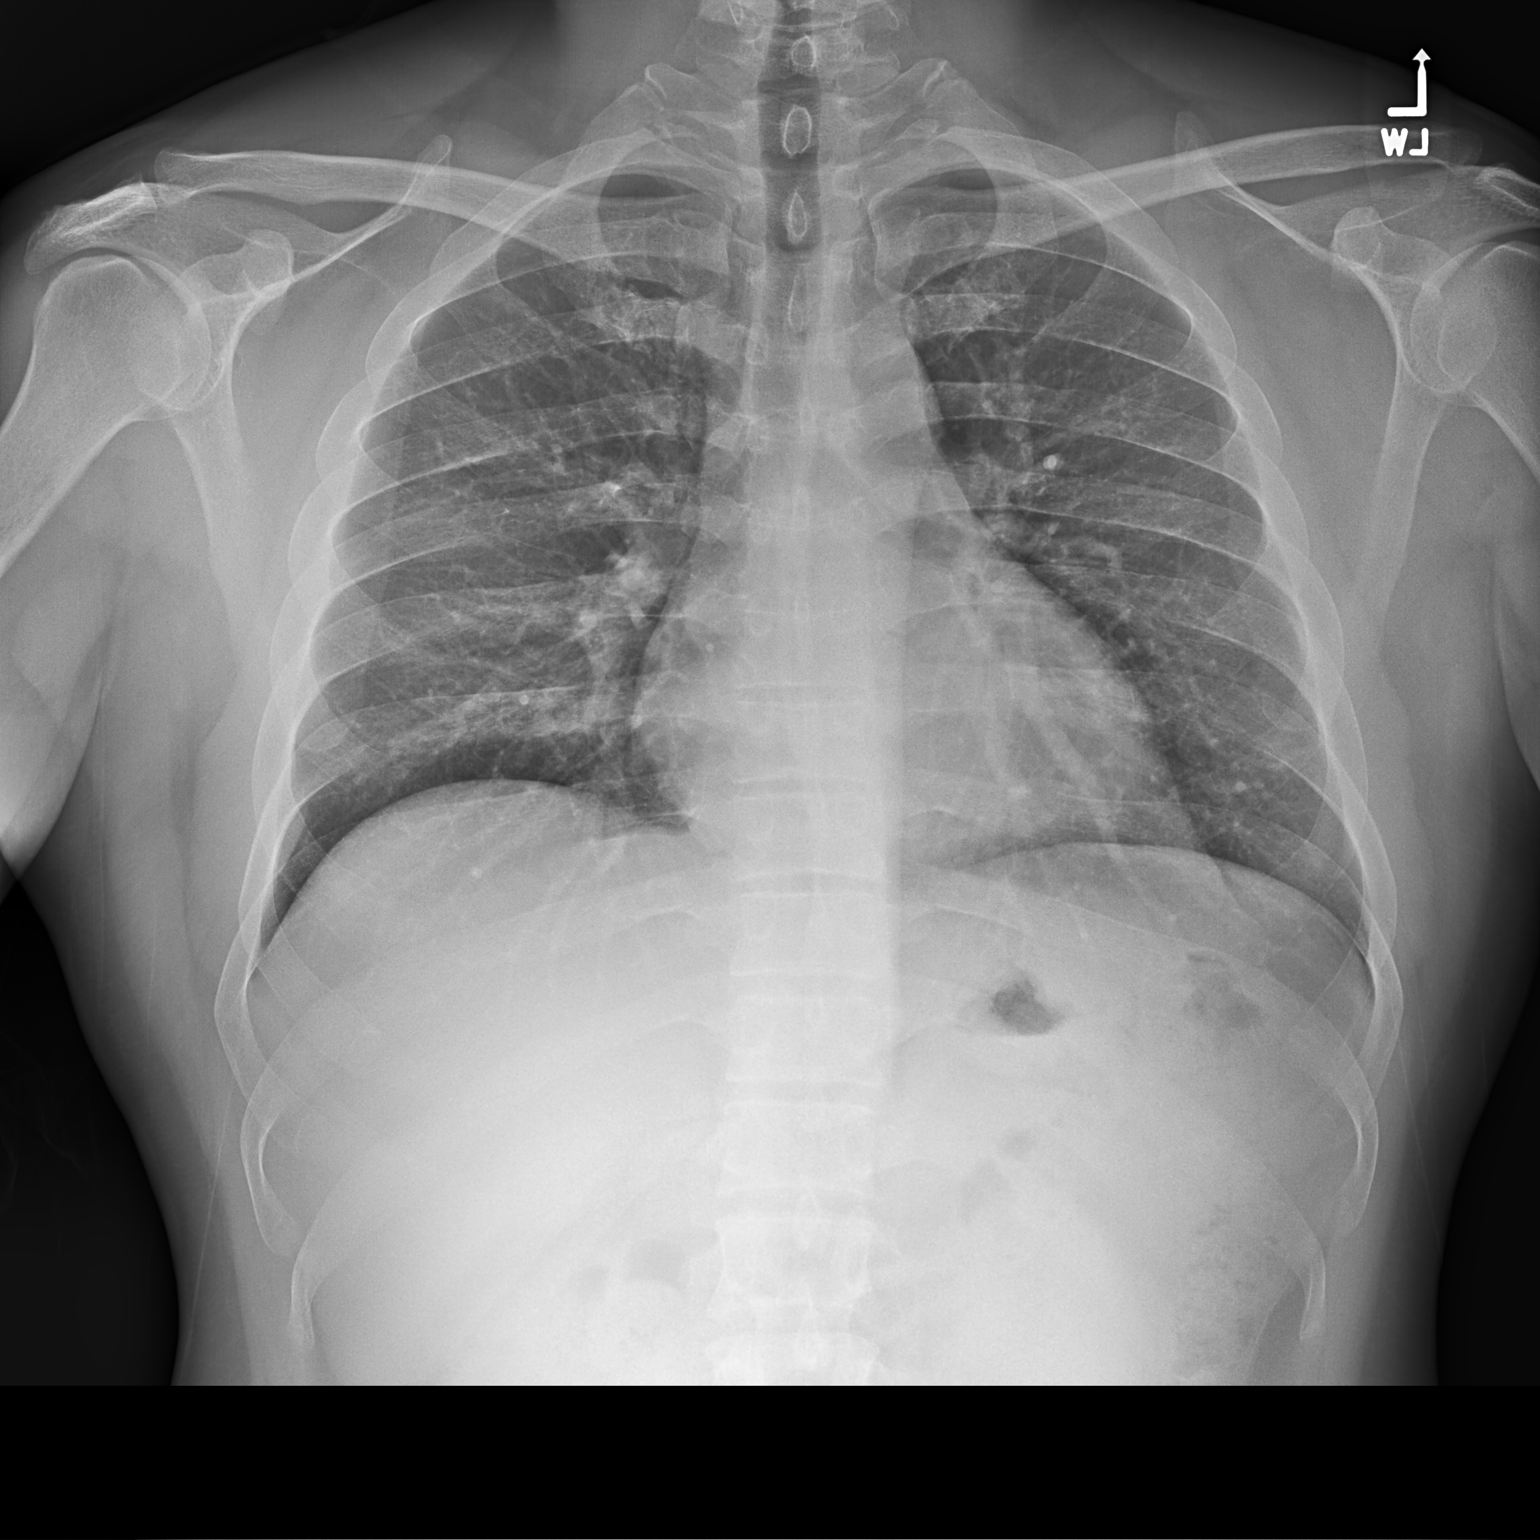

[chest lat]
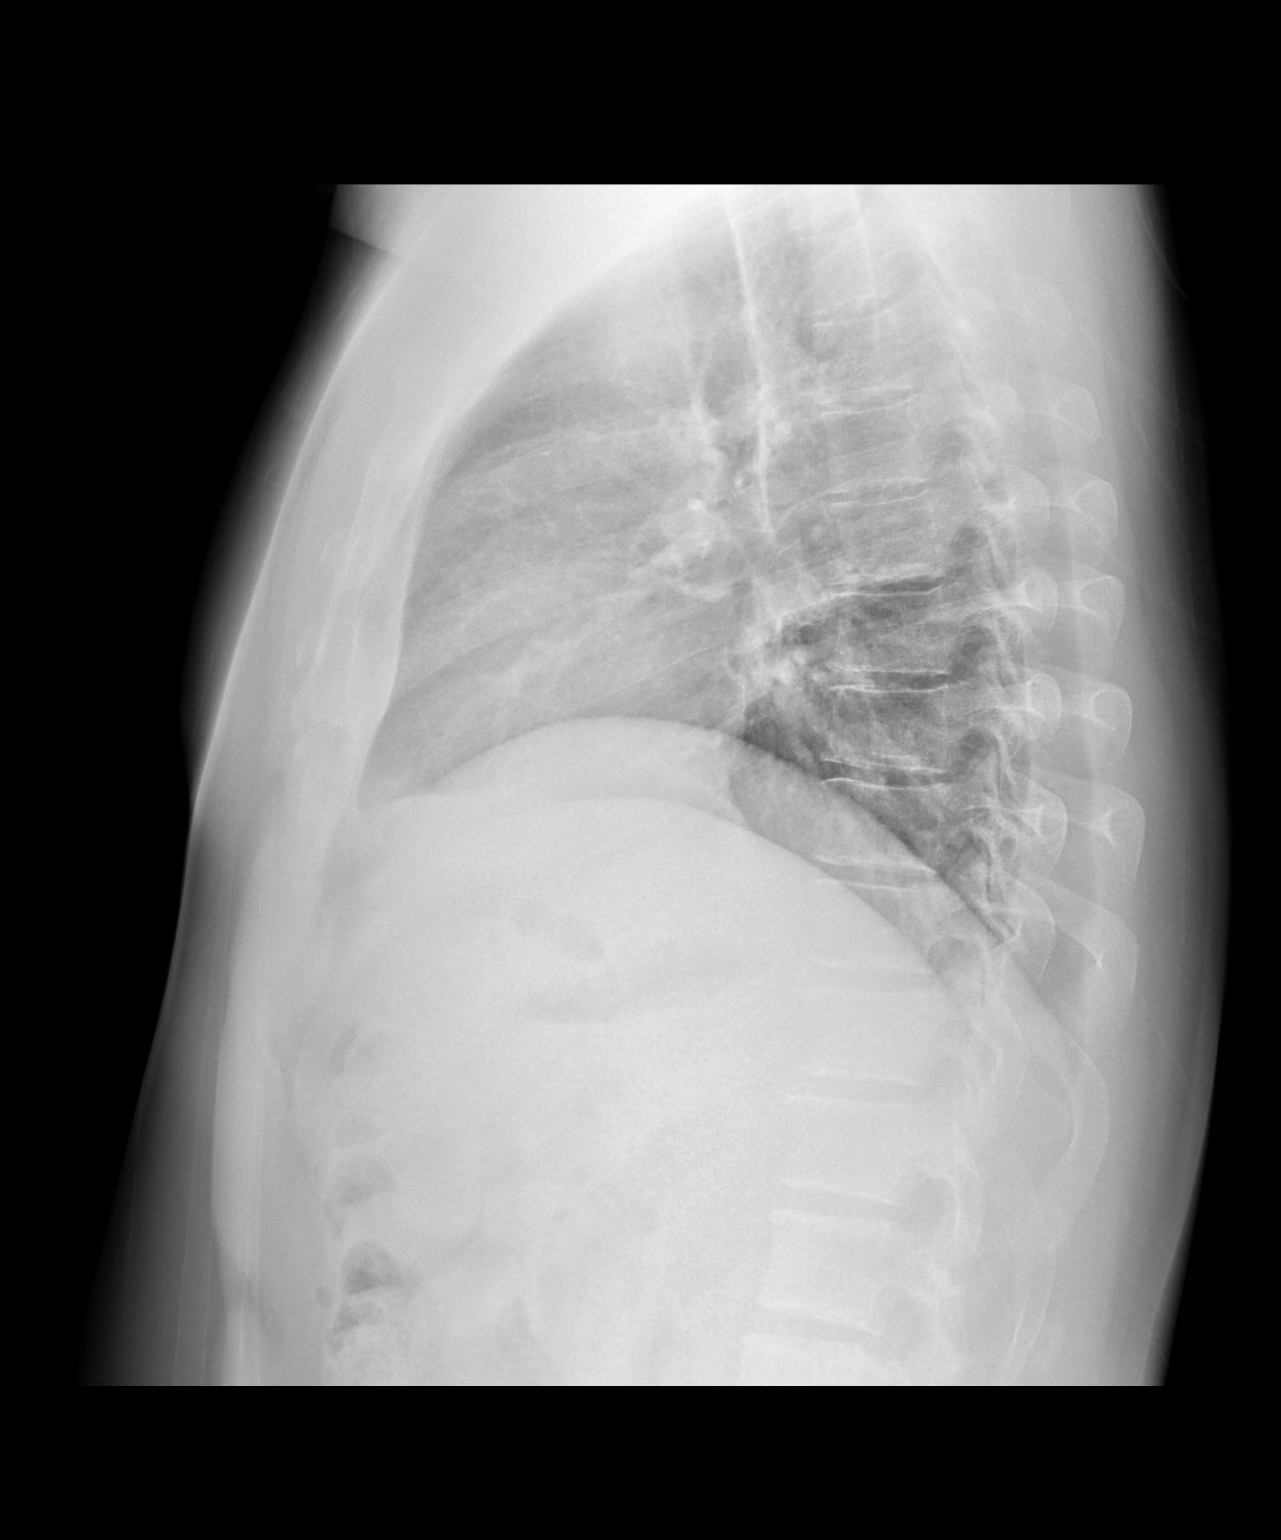

[2 of 2 positions shown; findings below may reference images not displayed]

FINDINGS: The heart size and mediastinal contours are within normal limits.
Both lungs are clear. The visualized skeletal structures are
unremarkable.
IMPRESSION: Negative.  No active cardiopulmonary disease.

## 2020-11-11 ENCOUNTER — Encounter: Payer: Self-pay | Admitting: General Surgery

## 2020-11-28 ENCOUNTER — Other Ambulatory Visit: Payer: Self-pay

## 2020-11-28 ENCOUNTER — Ambulatory Visit (INDEPENDENT_AMBULATORY_CARE_PROVIDER_SITE_OTHER): Payer: 59 | Admitting: Cardiology

## 2020-11-28 ENCOUNTER — Encounter: Payer: Self-pay | Admitting: Cardiology

## 2020-11-28 VITALS — BP 110/60 | HR 77 | Ht 62.5 in | Wt 180.0 lb

## 2020-11-28 DIAGNOSIS — I10 Essential (primary) hypertension: Secondary | ICD-10-CM | POA: Diagnosis not present

## 2020-11-28 DIAGNOSIS — R072 Precordial pain: Secondary | ICD-10-CM | POA: Diagnosis not present

## 2020-11-28 NOTE — Progress Notes (Signed)
Cardiology Office Note:    Date:  11/28/2020   ID:  Alan Collier, DOB 04-21-1975, MRN [id number]  PCP:  Burnard Hawthorne, [facility] Cardiologist:  Kate Sable, MD  [place] Electrophysiologist:  None   Referring MD: Burnard Hawthorne, FNP   Chief Complaint  Patient presents with   Other    6 month follow up -- Meds reviewed verbally with patient.     History of Present Illness:    Alan Collier is a 45 y.o. male with a hx of hypertension who presents for follow-up.  Patient previously seen due to chest pain.  Prior echocardiogram showed normal systolic and diastolic function.  His symptoms occur usually after he drinks coffee or eats certain foods.  Symptoms have improved significantly since taking omeprazole.  Blood pressure also well controlled on current dose of irbesartan.   Prior notes Echo 10/29/2019 EF 60 to 65%,  Past Medical History:  Diagnosis Date   Hypertension     Past Surgical History:  Procedure Laterality Date   STAPEDECTOMY Bilateral     Current Medications: Current Meds  Medication Sig   hydrocortisone (ANUSOL-HC) 25 MG suppository Place 1 suppository (25 mg total) rectally 2 (two) times daily.   irbesartan (AVAPRO) 300 MG tablet TAKE 1 TABLET BY MOUTH  DAILY   Lidocaine, Anorectal, 5 % CREA Apply thin layer to affected area every 6 hours as needed.   omeprazole (PRILOSEC) 20 MG capsule Take 1 capsule (20 mg total) by mouth daily.     Allergies:   Iodine and Shellfish allergy   Social History   Socioeconomic History   Marital status: Married    Spouse name: Not on file   Number of children: Not on file   Years of education: Not on file   Highest education level: Not on file  Occupational History   Not on file  Tobacco Use   Smoking status: Never   Smokeless tobacco: Never  Vaping Use   Vaping Use: Never used  Substance and Sexual Activity   Alcohol use: Yes    Comment: 1-3 beers per week   Drug use: Never    Sexual activity: Not on file  Other Topics Concern   Not on file  Social History Narrative   Wife is patient   34 year old son.    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   Social Determinants of Health   Financial Resource Strain: Not on file  Food Insecurity: Not on file  Transportation Needs: Not on file  Physical Activity: Not on file  Stress: Not on file  Social Connections: Not on file     Family History: The patient's family history includes Diabetes in his maternal grandfather; Gastric cancer in his maternal grandfather; Hypertension in his maternal grandfather and mother. There is no history of Prostate cancer or Colon cancer.  ROS:   Please see the history of present illness.     All other systems reviewed and are negative.  EKGs/Labs/Other Studies Reviewed:    The following studies were reviewed today:   EKG:  EKG is  ordered today.  The ekg ordered today demonstrates normal sinus rhythm, normal ECG.  Recent Labs: 08/18/2020: ALT 37; BUN 12; Creatinine, Ser 1.02; Potassium 4.5; Sodium 138  Recent Lipid Panel    Component Value Date/Time   CHOL 164 08/18/2020 1037   TRIG 297.0 (H) 08/18/2020 1037   HDL 34.20 (L) 08/18/2020  1037   CHOLHDL 5 08/18/2020 1037   VLDL 59.4 (H) 08/18/2020 1037   LDLCALC 78 07/10/2019 1121   LDLDIRECT 93.0 08/18/2020 1037    Physical Exam:    VS:  BP 110/60 (BP Location: Left Arm, Patient Position: Sitting, Cuff Size: Normal)   Pulse 77   Ht 5' 2.5" (1.588 m)   Wt 180 lb (81.6 kg)   SpO2 97%   BMI 32.40 kg/m     Wt Readings from Last 3 Encounters:  11/28/20 180 lb (81.6 kg)  08/08/20 177 lb 6.4 oz (80.5 kg)  05/19/20 172 lb (78 kg)     GEN:  Well nourished, well developed in no acute distress HEENT: Normal NECK: No JVD; No carotid bruits LYMPHATICS: No lymphadenopathy CARDIAC: RRR, no murmurs, rubs, gallops RESPIRATORY:  Clear to auscultation without rales, wheezing or rhonchi   ABDOMEN: Soft, non-tender, non-distended MUSCULOSKELETAL:  No edema; No deformity  SKIN: Warm and dry NEUROLOGIC:  Alert and oriented x 3 PSYCHIATRIC:  Normal affect   ASSESSMENT:    1. Precordial pain   2. Primary hypertension     PLAN:    In order of problems listed above:  Chest pain consistent with GI etiology/GERD.  Improved with omeprazole.  Prior echo was normal.  Continue omeprazole as prescribed. hypertension, BP controlled.  Continue Avapro 300mg  qd  Follow-up as needed  Total encounter time 30 minutes  Greater than 50% was spent in counseling and coordination of care with the patient  This note was generated in part or whole with voice recognition software. Voice recognition is usually quite accurate but there are transcription errors that can and very often do occur. I apologize for any typographical errors that were not detected and corrected.  Medication Adjustments/Labs and Tests Ordered: Current medicines are reviewed at length with the patient today.  Concerns regarding medicines are outlined above.  Orders Placed This Encounter  Procedures   EKG 12-Lead    No orders of the defined types were placed in this encounter.   Patient Instructions  Medication Instructions:   Your physician recommends that you continue on your current medications as directed. Please refer to the Current Medication list given to you today.  *If you need a refill on your cardiac medications before your next appointment, please call your pharmacy*   Lab Work:  None Ordered  If you have labs (blood work) drawn today and your tests are completely normal, you will receive your results only by: [place] (if you have MyChart) OR A paper copy in the mail If you have any lab test that is abnormal or we need to change your treatment, we will call you to review the results.   Testing/Procedures:  None Ordered   Follow-Up: At [facility], you and your health needs  are our priority.  As part of our continuing mission to provide you with exceptional heart care, we have created designated Provider Care Teams.  These Care Teams include your primary Cardiologist (physician) and Advanced Practice Providers (APPs -  Physician Assistants and Nurse Practitioners) who all work together to provide you with the care you need, when you need it.  We recommend signing up for the patient portal called "MyChart".  Sign up information is provided on this After Visit Summary.  MyChart is used to connect with patients for Virtual Visits (Telemedicine).  Patients are able to view lab/test results, encounter notes, upcoming appointments, etc.  Non-urgent messages can be sent to your provider as well.  To learn more about what you can do with MyChart, go to [email or web address].    Your next appointment:  As Needed     Signed, Kate Sable, MD  11/28/2020 11:58 AM    [place]

## 2020-11-28 NOTE — Patient Instructions (Signed)
Medication Instructions:   Your physician recommends that you continue on your current medications as directed. Please refer to the Current Medication list given to you today.  *If you need a refill on your cardiac medications before your next appointment, please call your pharmacy*   Lab Work:  None Ordered  If you have labs (blood work) drawn today and your tests are completely normal, you will receive your results only by: [place] (if you have MyChart) OR A paper copy in the mail If you have any lab test that is abnormal or we need to change your treatment, we will call you to review the results.   Testing/Procedures:  None Ordered   Follow-Up: At [facility], you and your health needs are our priority.  As part of our continuing mission to provide you with exceptional heart care, we have created designated Provider Care Teams.  These Care Teams include your primary Cardiologist (physician) and Advanced Practice Providers (APPs -  Physician Assistants and Nurse Practitioners) who all work together to provide you with the care you need, when you need it.  We recommend signing up for the patient portal called "MyChart".  Sign up information is provided on this After Visit Summary.  MyChart is used to connect with patients for Virtual Visits (Telemedicine).  Patients are able to view lab/test results, encounter notes, upcoming appointments, etc.  Non-urgent messages can be sent to your provider as well.   To learn more about what you can do with MyChart, go to [email or web address].    Your next appointment:  As Needed

## 2021-02-08 ENCOUNTER — Ambulatory Visit: Payer: 59 | Admitting: Family

## 2021-02-10 ENCOUNTER — Ambulatory Visit (INDEPENDENT_AMBULATORY_CARE_PROVIDER_SITE_OTHER): Payer: 59 | Admitting: Family

## 2021-02-10 ENCOUNTER — Other Ambulatory Visit: Payer: Self-pay

## 2021-02-10 ENCOUNTER — Encounter: Payer: Self-pay | Admitting: Family

## 2021-02-10 ENCOUNTER — Ambulatory Visit: Payer: 59 | Admitting: Family

## 2021-02-10 VITALS — BP 130/74 | HR 90 | Temp 98.0°F | Ht 62.5 in | Wt 182.6 lb

## 2021-02-10 DIAGNOSIS — K219 Gastro-esophageal reflux disease without esophagitis: Secondary | ICD-10-CM | POA: Diagnosis not present

## 2021-02-10 DIAGNOSIS — I10 Essential (primary) hypertension: Secondary | ICD-10-CM | POA: Diagnosis not present

## 2021-02-10 LAB — COMPREHENSIVE METABOLIC PANEL
ALT: 68 U/L — ABNORMAL HIGH (ref 0–53)
AST: 26 U/L (ref 0–37)
Albumin: 4.8 g/dL (ref 3.5–5.2)
Alkaline Phosphatase: 54 U/L (ref 39–117)
BUN: 11 mg/dL (ref 6–23)
CO2: 30 mEq/L (ref 19–32)
Calcium: 10.2 mg/dL (ref 8.4–10.5)
Chloride: 100 mEq/L (ref 96–112)
Creatinine, Ser: 0.96 mg/dL (ref 0.40–1.50)
GFR: 96.06 mL/min (ref >60.00)
Glucose, Bld: 129 mg/dL — ABNORMAL HIGH (ref 70–99)
Potassium: 4.3 mEq/L (ref 3.5–5.1)
Sodium: 138 mEq/L (ref 135–145)
Total Bilirubin: 0.8 mg/dL (ref 0.2–1.2)
Total Protein: 7.6 g/dL (ref 6.0–8.3)

## 2021-02-10 MED ORDER — AMLODIPINE BESYLATE 2.5 MG PO TABS: 2.5000 mg | ORAL_TABLET | Freq: Every day | ORAL | 1 refills | Status: AC | PRN

## 2021-02-10 NOTE — Assessment & Plan Note (Signed)
Chronic, stable.  Continue omeprazole 20 mg and  I counseled him on avoidance of trigger foods such as coffee and eating late. He will let me know of any concerns.

## 2021-02-10 NOTE — Assessment & Plan Note (Signed)
Chronic, stable. Discussed labile blood pressure. Advised that he may take amlodopine 2.5mg  prn qf if BP > 140/85.

## 2021-02-10 NOTE — Patient Instructions (Addendum)
As discussed, blood pressures are labile.  Please continue to spot check your blood pressure with the goal being less than 130/80, more often being less than 120/80.  may take amlodopine 2.5mg  prn qf if BP > 140/85.   Managing Your Hypertension Hypertension, also called high blood pressure, is when the force of the blood pressing against the walls of the arteries is too strong. Arteries are blood vessels that carry blood from your heart throughout your body. Hypertension forces the heart to work harder to pump blood and may cause the arteries to become narrow or stiff. Understanding blood pressure readings Your personal target blood pressure may vary depending on your medical conditions, your age, and other factors. A blood pressure reading includes a higher number over a lower number. Ideally, your blood pressure should be below 120/80. You should know that: The first, or top, number is called the systolic pressure. It is a measure of the pressure in your arteries as your heart beats. The second, or bottom number, is called the diastolic pressure. It is a measure of the pressure in your arteries as the heart relaxes. Blood pressure is classified into four stages. Based on your blood pressure reading, your health care provider may use the following stages to determine what type of treatment you need, if any. Systolic pressure and diastolic pressure are measured in a unit called mmHg. Normal Systolic pressure: below 858. Diastolic pressure: below 80. Elevated Systolic pressure: 850-277. Diastolic pressure: below 80. Hypertension stage 1 Systolic pressure: 412-878. Diastolic pressure: 67-67. Hypertension stage 2 Systolic pressure: 209 or above. Diastolic pressure: 90 or above. How can this condition affect me? Managing your hypertension is an important responsibility. Over time, hypertension can damage the arteries and decrease blood flow to important parts of the body, including the brain,  heart, and kidneys. Having untreated or uncontrolled hypertension can lead to: A heart attack. A stroke. A weakened blood vessel (aneurysm). Heart failure. Kidney damage. Eye damage. Metabolic syndrome. Memory and concentration problems. Vascular dementia. What actions can I take to manage this condition? Hypertension can be managed by making lifestyle changes and possibly by taking medicines. Your health care provider will help you make a plan to bring your blood pressure within a normal range. Nutrition  Eat a diet that is high in fiber and potassium, and low in salt (sodium), added sugar, and fat. An example eating plan is called the Dietary Approaches to Stop Hypertension (DASH) diet. To eat this way: Eat plenty of fresh fruits and vegetables. Try to fill one-half of your plate at each meal with fruits and vegetables. Eat whole grains, such as whole-wheat pasta, brown rice, or whole-grain bread. Fill about one-fourth of your plate with whole grains. Eat low-fat dairy products. Avoid fatty cuts of meat, processed or cured meats, and poultry with skin. Fill about one-fourth of your plate with lean proteins such as fish, chicken without skin, beans, eggs, and tofu. Avoid pre-made and processed foods. These tend to be higher in sodium, added sugar, and fat. Reduce your daily sodium intake. Most people with hypertension should eat less than 1,500 mg of sodium a day. Lifestyle  Work with your health care provider to maintain a healthy body weight or to lose weight. Ask what an ideal weight is for you. Get at least 30 minutes of exercise that causes your heart to beat faster (aerobic exercise) most days of the week. Activities may include walking, swimming, or biking. Include exercise to strengthen your muscles (resistance exercise),  such as weight lifting, as part of your weekly exercise routine. Try to do these types of exercises for 30 minutes at least 3 days a week. Do not use any products  that contain nicotine or tobacco, such as cigarettes, e-cigarettes, and chewing tobacco. If you need help quitting, ask your health care provider. Control any long-term (chronic) conditions you have, such as high cholesterol or diabetes. Identify your sources of stress and find ways to manage stress. This may include meditation, deep breathing, or making time for fun activities. Alcohol use Do not drink alcohol if: Your health care provider tells you not to drink. You are pregnant, may be pregnant, or are planning to become pregnant. If you drink alcohol: Limit how much you use to: 0-1 drink a day for women. 0-2 drinks a day for men. Be aware of how much alcohol is in your drink. In the [place] one drink equals one 12 oz bottle of beer (355 mL), one 5 oz glass of wine (148 mL), or one 1 oz glass of hard liquor (44 mL). Medicines Your health care provider may prescribe medicine if lifestyle changes are not enough to get your blood pressure under control and if: Your systolic blood pressure is 130 or higher. Your diastolic blood pressure is 80 or higher. Take medicines only as told by your health care provider. Follow the directions carefully. Blood pressure medicines must be taken as told by your health care provider. The medicine does not work as well when you skip doses. Skipping doses also puts you at risk for problems. Monitoring Before you monitor your blood pressure: Do not smoke, drink caffeinated beverages, or exercise within 30 minutes before taking a measurement. Use the bathroom and empty your bladder (urinate). Sit quietly for at least 5 minutes before taking measurements. Monitor your blood pressure at home as told by your health care provider. To do this: Sit with your back straight and supported. Place your feet flat on the floor. Do not cross your legs. Support your arm on a flat surface, such as a table. Make sure your upper arm is at heart level. Each time you measure, take  two or three readings one minute apart and record the results. You may also need to have your blood pressure checked regularly by your health care provider. General information Talk with your health care provider about your diet, exercise habits, and other lifestyle factors that may be contributing to hypertension. Review all the medicines you take with your health care provider because there may be side effects or interactions. Keep all visits as told by your health care provider. Your health care provider can help you create and adjust your plan for managing your high blood pressure. Where to find more information National Heart, Lung, and Blood Institute: https://wilson-eaton.com/ American Heart Association: www.heart.org Contact a health care provider if: You think you are having a reaction to medicines you have taken. You have repeated (recurrent) headaches. You feel dizzy. You have swelling in your ankles. You have trouble with your vision. Get help right away if: You develop a severe headache or confusion. You have unusual weakness or numbness, or you feel faint. You have severe pain in your chest or abdomen. You vomit repeatedly. You have trouble breathing. These symptoms may represent a serious problem that is an emergency. Do not wait to see if the symptoms will go away. Get medical help right away. Call your local emergency services (911 in the [place] Do not drive yourself to  the hospital. Summary Hypertension is when the force of blood pumping through your arteries is too strong. If this condition is not controlled, it may put you at risk for serious complications. Your personal target blood pressure may vary depending on your medical conditions, your age, and other factors. For most people, a normal blood pressure is less than 120/80. Hypertension is managed by lifestyle changes, medicines, or both. Lifestyle changes to help manage hypertension include losing weight, eating a healthy,  low-sodium diet, exercising more, stopping smoking, and limiting alcohol. This information is not intended to replace advice given to you by your health care provider. Make sure you discuss any questions you have with your health care provider. Document Revised: 02/16/2019 Document Reviewed: 12/30/2018 Elsevier Patient Education  2022 [place] Eating Plan DASH stands for Dietary Approaches to Stop Hypertension. The DASH eating plan is a healthy eating plan that has been shown to: Reduce high blood pressure (hypertension). Reduce your risk for type 2 diabetes, heart disease, and stroke. Help with weight loss. What are tips for following this plan? Reading food labels Check food labels for the amount of salt (sodium) per serving. Choose foods with less than 5 percent of the Daily Value of sodium. Generally, foods with less than 300 milligrams (mg) of sodium per serving fit into this eating plan. To find whole grains, look for the word "whole" as the first word in the ingredient list. Shopping Buy products labeled as "low-sodium" or "no salt added." Buy fresh foods. Avoid canned foods and pre-made or frozen meals. Cooking Avoid adding salt when cooking. Use salt-free seasonings or herbs instead of table salt or sea salt. Check with your health care provider or pharmacist before using salt substitutes. Do not fry foods. Cook foods using healthy methods such as baking, boiling, grilling, roasting, and broiling instead. Cook with heart-healthy oils, such as olive, canola, avocado, soybean, or sunflower oil. Meal planning  Eat a balanced diet that includes: 4 or more servings of fruits and 4 or more servings of vegetables each day. Try to fill one-half of your plate with fruits and vegetables. 6-8 servings of whole grains each day. Less than 6 oz (170 g) of lean meat, poultry, or fish each day. A 3-oz (85-g) serving of meat is about the same size as a deck of cards. One egg equals 1 oz  (28 g). 2-3 servings of low-fat dairy each day. One serving is 1 cup (237 mL). 1 serving of nuts, seeds, or beans 5 times each week. 2-3 servings of heart-healthy fats. Healthy fats called omega-3 fatty acids are found in foods such as walnuts, flaxseeds, fortified milks, and eggs. These fats are also found in cold-water fish, such as sardines, salmon, and mackerel. Limit how much you eat of: Canned or prepackaged foods. Food that is high in trans fat, such as some fried foods. Food that is high in saturated fat, such as fatty meat. Desserts and other sweets, sugary drinks, and other foods with added sugar. Full-fat dairy products. Do not salt foods before eating. Do not eat more than 4 egg yolks a week. Try to eat at least 2 vegetarian meals a week. Eat more home-cooked food and less restaurant, buffet, and fast food. Lifestyle When eating at a restaurant, ask that your food be prepared with less salt or no salt, if possible. If you drink alcohol: Limit how much you use to: 0-1 drink a day for women who are not pregnant. 0-2 drinks a day for  men. Be aware of how much alcohol is in your drink. In the [place] one drink equals one 12 oz bottle of beer (355 mL), one 5 oz glass of wine (148 mL), or one 1 oz glass of hard liquor (44 mL). General information Avoid eating more than 2,300 mg of salt a day. If you have hypertension, you may need to reduce your sodium intake to 1,500 mg a day. Work with your health care provider to maintain a healthy body weight or to lose weight. Ask what an ideal weight is for you. Get at least 30 minutes of exercise that causes your heart to beat faster (aerobic exercise) most days of the week. Activities may include walking, swimming, or biking. Work with your health care provider or dietitian to adjust your eating plan to your individual calorie needs. What foods should I eat? Fruits All fresh, dried, or frozen fruit. Canned fruit in natural juice (without  added sugar). Vegetables Fresh or frozen vegetables (raw, steamed, roasted, or grilled). Low-sodium or reduced-sodium tomato and vegetable juice. Low-sodium or reduced-sodium tomato sauce and tomato paste. Low-sodium or reduced-sodium canned vegetables. Grains Whole-grain or whole-wheat bread. Whole-grain or whole-wheat pasta. Brown rice. [place]. Bulgur. Whole-grain and low-sodium cereals. Pita bread. Low-fat, low-sodium crackers. Whole-wheat flour tortillas. Meats and other proteins Skinless chicken or [place]. Ground chicken or [place]. Pork with fat trimmed off. Fish and seafood. Egg whites. Dried beans, peas, or lentils. Unsalted nuts, nut butters, and seeds. Unsalted canned beans. Lean cuts of beef with fat trimmed off. Low-sodium, lean precooked or cured meat, such as sausages or meat loaves. Dairy Low-fat (1%) or fat-free (skim) milk. Reduced-fat, low-fat, or fat-free cheeses. Nonfat, low-sodium ricotta or cottage cheese. Low-fat or nonfat yogurt. Low-fat, low-sodium cheese. Fats and oils Soft margarine without trans fats. Vegetable oil. Reduced-fat, low-fat, or light mayonnaise and salad dressings (reduced-sodium). Canola, safflower, olive, avocado, soybean, and sunflower oils. Avocado. Seasonings and condiments Herbs. Spices. Seasoning mixes without salt. Other foods Unsalted popcorn and pretzels. Fat-free sweets. The items listed above may not be a complete list of foods and beverages you can eat. Contact a dietitian for more information. What foods should I avoid? Fruits Canned fruit in a light or heavy syrup. Fried fruit. Fruit in cream or butter sauce. Vegetables Creamed or fried vegetables. Vegetables in a cheese sauce. Regular canned vegetables (not low-sodium or reduced-sodium). Regular canned tomato sauce and paste (not low-sodium or reduced-sodium). Regular tomato and vegetable juice (not low-sodium or reduced-sodium). Angie Fava. Olives. Grains Baked goods made with fat,  such as croissants, muffins, or some breads. Dry pasta or rice meal packs. Meats and other proteins Fatty cuts of meat. Ribs. Fried meat. Berniece Salines. Bologna, salami, and other precooked or cured meats, such as sausages or meat loaves. Fat from the back of a pig (fatback). Bratwurst. Salted nuts and seeds. Canned beans with added salt. Canned or smoked fish. Whole eggs or egg yolks. Chicken or [place] with skin. Dairy Whole or 2% milk, cream, and half-and-half. Whole or full-fat cream cheese. Whole-fat or sweetened yogurt. Full-fat cheese. Nondairy creamers. Whipped toppings. Processed cheese and cheese spreads. Fats and oils Butter. Stick margarine. Lard. Shortening. Ghee. Bacon fat. Tropical oils, such as coconut, palm kernel, or palm oil. Seasonings and condiments Onion salt, garlic salt, seasoned salt, table salt, and sea salt. Worcestershire sauce. Tartar sauce. Barbecue sauce. Teriyaki sauce. Soy sauce, including reduced-sodium. Steak sauce. Canned and packaged gravies. Fish sauce. Oyster sauce. Cocktail sauce. Store-bought horseradish. Ketchup. Mustard. Meat flavorings and  tenderizers. Bouillon cubes. Hot sauces. Pre-made or packaged marinades. Pre-made or packaged taco seasonings. Relishes. Regular salad dressings. Other foods Salted popcorn and pretzels. The items listed above may not be a complete list of foods and beverages you should avoid. Contact a dietitian for more information. Where to find more information National Heart, Lung, and Blood Institute: https://wilson-eaton.com/ American Heart Association: www.heart.org Academy of Nutrition and Dietetics: www.eatright.[place]: www.kidney.org Summary The DASH eating plan is a healthy eating plan that has been shown to reduce high blood pressure (hypertension). It may also reduce your risk for type 2 diabetes, heart disease, and stroke. When on the DASH eating plan, aim to eat more fresh fruits and vegetables, whole grains,  lean proteins, low-fat dairy, and heart-healthy fats. With the DASH eating plan, you should limit salt (sodium) intake to 2,300 mg a day. If you have hypertension, you may need to reduce your sodium intake to 1,500 mg a day. Work with your health care provider or dietitian to adjust your eating plan to your individual calorie needs. This information is not intended to replace advice given to you by your health care provider. Make sure you discuss any questions you have with your health care provider. Document Revised: 01/02/2019 Document Reviewed: 01/02/2019 Elsevier Patient Education  2022 [employer].

## 2021-02-10 NOTE — Progress Notes (Signed)
Subjective:    Patient ID: Alan Collier, male    DOB: [birth year], 45 y.o.   MRN: [id number]  CC: Alan Collier is a 45 y.o. male who presents today for follow up.   HPI: Feels well today.  No new complaints  hypertension-compliant with Avapro 300 mg qam. Reports a couple of nights ago had reading 170/100 on bicep BP machine and then returned to 140/90 when used his wrist reading. He used amlodipine 2.5mg  that night. No cp, left arm pain.  Epigastric burning controlled with omeprazole 20mg . Triggered by coffee and eating late.  He feels epigastric pain related to GERD and he has no follow up scheduled with cardiology.      HISTORY:  Past Medical History:  Diagnosis Date   Hypertension    Past Surgical History:  Procedure Laterality Date   STAPEDECTOMY Bilateral    Family History  Problem Relation Age of Onset   Hypertension Mother    Hypertension Maternal Grandfather    Diabetes Maternal Grandfather    Gastric cancer Maternal Grandfather    Prostate cancer Neg Hx    Colon cancer Neg Hx     Allergies: Iodine and Shellfish allergy Current Outpatient Medications on File Prior to Visit  Medication Sig Dispense Refill   hydrocortisone (ANUSOL-HC) 25 MG suppository Place 1 suppository (25 mg total) rectally 2 (two) times daily. 12 suppository 0   irbesartan (AVAPRO) 300 MG tablet TAKE 1 TABLET BY MOUTH  DAILY 90 tablet 3   Lidocaine, Anorectal, 5 % CREA Apply thin layer to affected area every 6 hours as needed. 28 g 0   omeprazole (PRILOSEC) 20 MG capsule Take 1 capsule (20 mg total) by mouth daily. 90 capsule 3   No current facility-administered medications on file prior to visit.    Social History   Tobacco Use   Smoking status: Never   Smokeless tobacco: Never  Vaping Use   Vaping Use: Never used  Substance Use Topics   Alcohol use: Yes    Comment: 1-3 beers per week   Drug use: Never    Review of Systems  Constitutional:  Negative for chills and fever.   Respiratory:  Negative for cough.   Cardiovascular:  Negative for chest pain and palpitations.  Gastrointestinal:  Negative for nausea and vomiting.     Objective:    BP 130/74 (BP Location: Left Arm, Patient Position: Sitting, Cuff Size: Normal)    Pulse 90    Temp 98 F (36.7 C) (Oral)    Ht 5' 2.5" (1.588 m)    Wt 182 lb 9.6 oz (82.8 kg)    SpO2 98%    BMI 32.87 kg/m  BP Readings from Last 3 Encounters:  02/10/21 130/74  11/28/20 110/60  08/08/20 112/66   Wt Readings from Last 3 Encounters:  02/10/21 182 lb 9.6 oz (82.8 kg)  11/28/20 180 lb (81.6 kg)  08/08/20 177 lb 6.4 oz (80.5 kg)    Physical Exam Vitals reviewed.  Constitutional:      Appearance: He is well-developed.  Cardiovascular:     Rate and Rhythm: Regular rhythm.     Heart sounds: Normal heart sounds.  Pulmonary:     Effort: Pulmonary effort is normal. No respiratory distress.     Breath sounds: Normal breath sounds. No wheezing, rhonchi or rales.  Skin:    General: Skin is warm and dry.  Neurological:     Mental Status: He is alert.  Psychiatric:  Speech: Speech normal.        Behavior: Behavior normal.       Assessment & Plan:   Problem List Items Addressed This Visit       Cardiovascular and Mediastinum   Hypertension - Primary    Chronic, stable. Discussed labile blood pressure. Advised that he may take amlodopine 2.5mg  prn qf if BP > 140/85.       Relevant Medications   amLODipine (NORVASC) 2.5 MG tablet   Other Relevant Orders   Comprehensive metabolic panel     Digestive   GERD (gastroesophageal reflux disease)    Chronic, stable.  Continue omeprazole 20 mg and  I counseled him on avoidance of trigger foods such as coffee and eating late. He will let me know of any concerns.         I am having [place] start on amLODipine. I am also having him maintain his hydrocortisone, Lidocaine (Anorectal), omeprazole, and irbesartan.   Meds ordered this encounter  Medications    amLODipine (NORVASC) 2.5 MG tablet    Sig: Take 1 tablet (2.5 mg total) by mouth daily as needed. For BP 140/85.    Dispense:  30 tablet    Refill:  1    Order Specific Question:   Supervising Provider    Answer:   Crecencio Mc [[id number]]    Return precautions given.   Risks, benefits, and alternatives of the medications and treatment plan prescribed today were discussed, and patient expressed understanding.   Education regarding symptom management and diagnosis given to patient on AVS.  Continue to follow with Burnard Hawthorne, FNP for routine health maintenance.   [facility] and I agreed with plan.   [name], FNP

## 2021-02-14 ENCOUNTER — Other Ambulatory Visit: Payer: Self-pay | Admitting: Family

## 2021-02-14 DIAGNOSIS — R899 Unspecified abnormal finding in specimens from other organs, systems and tissues: Secondary | ICD-10-CM

## 2021-02-14 NOTE — Progress Notes (Signed)
I called and spoke with patient & he had not fasted for labs. He stated that he has also had elevated liver enzymes in the past. He had an US done that showed fatty liver.

## 2021-02-15 ENCOUNTER — Encounter: Payer: Self-pay | Admitting: Family

## 2021-02-24 ENCOUNTER — Telehealth: Payer: Self-pay | Admitting: Family

## 2021-02-24 NOTE — Telephone Encounter (Signed)
I spoke with patient & he still prefers to have his labs done before agreeing to GI referral or [place]. I have placed disc upfront for him to pick up when he has labs.

## 2021-02-24 NOTE — Telephone Encounter (Signed)
noted 

## 2021-02-24 NOTE — Telephone Encounter (Signed)
Call patient I received the paper copy printout of his right upper quadrant ultrasound for abnormal liver function test.  It does reveal fatty infiltration.  In this setting, as  his liver enzymes remain elevated, I would recommend a GI consult for further evaluation of disease.   fatty liver disease can be progressive and  this exam was done 8 years ago.  A lot to change in that time.   I understand the financial constraints however again I would recommend patient has another right upper quadrant ultrasound to update from prior.    Please let him know that I am unable to look at the CD.  If he would like to pick up for his own records.  Please offer him to do so . I placed on your desk

## 2021-03-29 ENCOUNTER — Other Ambulatory Visit (INDEPENDENT_AMBULATORY_CARE_PROVIDER_SITE_OTHER): Payer: 59

## 2021-03-29 ENCOUNTER — Other Ambulatory Visit: Payer: Self-pay

## 2021-03-29 DIAGNOSIS — R899 Unspecified abnormal finding in specimens from other organs, systems and tissues: Secondary | ICD-10-CM

## 2021-03-29 LAB — COMPREHENSIVE METABOLIC PANEL
ALT: 49 U/L (ref 0–53)
AST: 19 U/L (ref 0–37)
Albumin: 4.7 g/dL (ref 3.5–5.2)
Alkaline Phosphatase: 54 U/L (ref 39–117)
BUN: 12 mg/dL (ref 6–23)
CO2: 30 mEq/L (ref 19–32)
Calcium: 9.7 mg/dL (ref 8.4–10.5)
Chloride: 103 mEq/L (ref 96–112)
Creatinine, Ser: 0.98 mg/dL (ref 0.40–1.50)
GFR: 93.63 mL/min (ref >60.00)
Glucose, Bld: 105 mg/dL — ABNORMAL HIGH (ref 70–99)
Potassium: 4.5 mEq/L (ref 3.5–5.1)
Sodium: 140 mEq/L (ref 135–145)
Total Bilirubin: 1.1 mg/dL (ref 0.2–1.2)
Total Protein: 7.9 g/dL (ref 6.0–8.3)

## 2021-03-29 LAB — HEMOGLOBIN A1C: Hgb A1c MFr Bld: 4.9 % (ref 4.6–6.5)

## 2021-03-31 ENCOUNTER — Encounter: Payer: Self-pay | Admitting: Family

## 2021-05-19 ENCOUNTER — Other Ambulatory Visit: Payer: Self-pay | Admitting: Family

## 2021-05-19 DIAGNOSIS — K219 Gastro-esophageal reflux disease without esophagitis: Secondary | ICD-10-CM

## 2021-05-19 DIAGNOSIS — I10 Essential (primary) hypertension: Secondary | ICD-10-CM

## 2021-08-11 ENCOUNTER — Encounter: Payer: Self-pay | Admitting: Family

## 2021-08-11 ENCOUNTER — Ambulatory Visit (INDEPENDENT_AMBULATORY_CARE_PROVIDER_SITE_OTHER): Payer: 59 | Admitting: Family

## 2021-08-11 VITALS — BP 128/88 | HR 72 | Temp 98.0°F | Ht 62.0 in | Wt 177.4 lb

## 2021-08-11 DIAGNOSIS — Z1211 Encounter for screening for malignant neoplasm of colon: Secondary | ICD-10-CM | POA: Diagnosis not present

## 2021-08-11 DIAGNOSIS — Z1159 Encounter for screening for other viral diseases: Secondary | ICD-10-CM

## 2021-08-11 DIAGNOSIS — Z136 Encounter for screening for cardiovascular disorders: Secondary | ICD-10-CM | POA: Diagnosis not present

## 2021-08-11 DIAGNOSIS — Z125 Encounter for screening for malignant neoplasm of prostate: Secondary | ICD-10-CM | POA: Diagnosis not present

## 2021-08-11 DIAGNOSIS — I10 Essential (primary) hypertension: Secondary | ICD-10-CM | POA: Diagnosis not present

## 2021-08-11 LAB — LIPID PANEL
Cholesterol: 175 mg/dL (ref 0–200)
HDL: 37.5 mg/dL — ABNORMAL LOW (ref >39.00)
NonHDL: 137
Total CHOL/HDL Ratio: 5
Triglycerides: 233 mg/dL — ABNORMAL HIGH (ref 0.0–149.0)
VLDL: 46.6 mg/dL — ABNORMAL HIGH (ref 0.0–40.0)

## 2021-08-11 LAB — CBC WITH DIFFERENTIAL/PLATELET
Basophils Absolute: 0 10*3/uL (ref 0.0–0.1)
Basophils Relative: 0.6 % (ref 0.0–3.0)
Eosinophils Absolute: 0.1 10*3/uL (ref 0.0–0.7)
Eosinophils Relative: 2.4 % (ref 0.0–5.0)
HCT: 46.4 % (ref 39.0–52.0)
Hemoglobin: 15.6 g/dL (ref 13.0–17.0)
Lymphocytes Relative: 42.7 % (ref 12.0–46.0)
Lymphs Abs: 2.4 10*3/uL (ref 0.7–4.0)
MCHC: 33.6 g/dL (ref 30.0–36.0)
MCV: 87.3 fl (ref 78.0–100.0)
Monocytes Absolute: 0.4 10*3/uL (ref 0.1–1.0)
Monocytes Relative: 6.6 % (ref 3.0–12.0)
Neutro Abs: 2.7 10*3/uL (ref 1.4–7.7)
Neutrophils Relative %: 47.7 % (ref 43.0–77.0)
Platelets: 238 10*3/uL (ref 150.0–400.0)
RBC: 5.31 Mil/uL (ref 4.22–5.81)
RDW: 15 % (ref 11.5–15.5)
WBC: 5.6 10*3/uL (ref 4.0–10.5)

## 2021-08-11 LAB — COMPREHENSIVE METABOLIC PANEL
ALT: 48 U/L (ref 0–53)
AST: 23 U/L (ref 0–37)
Albumin: 4.7 g/dL (ref 3.5–5.2)
Alkaline Phosphatase: 53 U/L (ref 39–117)
BUN: 14 mg/dL (ref 6–23)
CO2: 26 mEq/L (ref 19–32)
Calcium: 9.7 mg/dL (ref 8.4–10.5)
Chloride: 103 mEq/L (ref 96–112)
Creatinine, Ser: 1.02 mg/dL (ref 0.40–1.50)
GFR: 89.01 mL/min (ref >60.00)
Glucose, Bld: 110 mg/dL — ABNORMAL HIGH (ref 70–99)
Potassium: 4.3 mEq/L (ref 3.5–5.1)
Sodium: 138 mEq/L (ref 135–145)
Total Bilirubin: 0.6 mg/dL (ref 0.2–1.2)
Total Protein: 7.4 g/dL (ref 6.0–8.3)

## 2021-08-11 LAB — PSA: PSA: 0.72 ng/mL (ref 0.10–4.00)

## 2021-08-11 LAB — LDL CHOLESTEROL, DIRECT: Direct LDL: 108 mg/dL

## 2021-08-11 NOTE — Assessment & Plan Note (Addendum)
Chronic, stable. Continue ibesartan '300mg'$ . Rare use of amlodipine 2.'5mg'$  qd prn.

## 2021-08-11 NOTE — Progress Notes (Signed)
Subjective:    Patient ID: Alan Collier, male    DOB: 1975-10-08, 46 y.o.   MRN: [id number]  CC: Alan Collier is a 46 y.o. male who presents today for follow up.   HPI: Feels well today Blood pressure at home is 121/83.  No cp, sob, palpitations, dizziness.     HTN- compliant with ibesartan '300mg'$ . He has used amlodipine 2.'5mg'$  once since prior visit.    HISTORY:  Past Medical History:  Diagnosis Date   Hypertension    Past Surgical History:  Procedure Laterality Date   STAPEDECTOMY Bilateral    Family History  Problem Relation Age of Onset   Hypertension Mother    Hypertension Maternal Grandfather    Diabetes Maternal Grandfather    Gastric cancer Maternal Grandfather    Prostate cancer Neg Hx    Colon cancer Neg Hx     Allergies: Iodine and Shellfish allergy Current Outpatient Medications on File Prior to Visit  Medication Sig Dispense Refill   amLODipine (NORVASC) 2.5 MG tablet Take 1 tablet (2.5 mg total) by mouth daily as needed. For BP 140/85. 30 tablet 1   hydrocortisone (ANUSOL-HC) 25 MG suppository Place 1 suppository (25 mg total) rectally 2 (two) times daily. 12 suppository 0   irbesartan (AVAPRO) 300 MG tablet TAKE 1 TABLET BY MOUTH  DAILY 90 tablet 3   Lidocaine, Anorectal, 5 % CREA Apply thin layer to affected area every 6 hours as needed. 28 g 0   omeprazole (PRILOSEC) 20 MG capsule TAKE 1 CAPSULE BY MOUTH  DAILY 90 capsule 3   No current facility-administered medications on file prior to visit.    Social History   Tobacco Use   Smoking status: Never   Smokeless tobacco: Never  Vaping Use   Vaping Use: Never used  Substance Use Topics   Alcohol use: Yes    Comment: 1-3 beers per week   Drug use: Never    Review of Systems  Constitutional:  Negative for chills and fever.  Respiratory:  Negative for cough.   Cardiovascular:  Negative for chest pain and palpitations.  Gastrointestinal:  Negative for nausea and vomiting.      Objective:     BP 128/88 (BP Location: Left Arm, Patient Position: Sitting, Cuff Size: Normal)   Pulse 72   Temp 98 F (36.7 C) (Oral)   Ht '5\' 2"'$  (1.575 m)   Wt 177 lb 6.4 oz (80.5 kg)   SpO2 97%   BMI 32.45 kg/m  BP Readings from Last 3 Encounters:  08/11/21 128/88  02/10/21 130/74  11/28/20 110/60   Wt Readings from Last 3 Encounters:  08/11/21 177 lb 6.4 oz (80.5 kg)  02/10/21 182 lb 9.6 oz (82.8 kg)  11/28/20 180 lb (81.6 kg)    Physical Exam Vitals reviewed.  Constitutional:      Appearance: He is well-developed.  Cardiovascular:     Rate and Rhythm: Regular rhythm.     Heart sounds: Normal heart sounds.  Pulmonary:     Effort: Pulmonary effort is normal. No respiratory distress.     Breath sounds: Normal breath sounds. No wheezing, rhonchi or rales.  Skin:    General: Skin is warm and dry.  Neurological:     Mental Status: He is alert.  Psychiatric:        Speech: Speech normal.        Behavior: Behavior normal.        Assessment & Plan:   Problem List Items Addressed  This Visit       Cardiovascular and Mediastinum   Hypertension    Chronic, stable. Continue ibesartan '300mg'$ . Rare use of amlodipine 2.'5mg'$  qd prn.       Relevant Orders   CBC with Differential/Platelet   Comprehensive metabolic panel   Lipid panel   Other Visit Diagnoses     Screening for colon cancer    -  Primary   Relevant Orders   Ambulatory referral to Gastroenterology   Screening for prostate cancer       Relevant Orders   PSA   Encounter for hepatitis C screening test for low risk patient       Relevant Orders   Hepatitis C antibody        I am having Falls maintain his hydrocortisone, Lidocaine (Anorectal), amLODipine, irbesartan, and omeprazole.   No orders of the defined types were placed in this encounter.   Return precautions given.   Risks, benefits, and alternatives of the medications and treatment plan prescribed today were discussed, and patient  expressed understanding.   Education regarding symptom management and diagnosis given to patient on AVS.  Continue to follow with Burnard Hawthorne, FNP for routine health maintenance.   [facility] and I agreed with plan.   [name], FNP

## 2021-08-11 NOTE — Patient Instructions (Signed)
Referral for colonoscopy Let us know if you dont hear back within a week in regards to an appointment being scheduled.

## 2021-08-11 NOTE — Progress Notes (Signed)
referral for colonoscopy

## 2021-08-14 LAB — HEPATITIS C ANTIBODY: Hepatitis C Ab: NONREACTIVE

## 2021-08-17 ENCOUNTER — Other Ambulatory Visit: Payer: Self-pay

## 2021-08-17 DIAGNOSIS — Z1211 Encounter for screening for malignant neoplasm of colon: Secondary | ICD-10-CM

## 2021-08-17 MED ORDER — PEG 3350-KCL-NA BICARB-NACL 420 G PO SOLR: 4000.0000 mL | Freq: Once | ORAL | 0 refills | Status: AC

## 2021-08-17 NOTE — Progress Notes (Signed)
Gastroenterology Pre-Procedure Review  Request Date: 09/11/2021 Requesting Physician: Dr. Vicente Males  PATIENT REVIEW QUESTIONS: The patient responded to the following health history questions as indicated:    1. Are you having any GI issues? no 2. Do you have a personal history of Polyps? no 3. Do you have a family history of Colon Cancer or Polyps? no 4. Diabetes Mellitus? no 5. Joint replacements in the past 12 months?no 6. Major health problems in the past 3 months?no 7. Any artificial heart valves, MVP, or defibrillator?no    MEDICATIONS & ALLERGIES:    Patient reports the following regarding taking any anticoagulation/antiplatelet therapy:   Plavix, Coumadin, Eliquis, Xarelto, Lovenox, Pradaxa, Brilinta, or Effient? no Aspirin? no  Patient confirms/reports the following medications:  Current Outpatient Medications  Medication Sig Dispense Refill   amLODipine (NORVASC) 2.5 MG tablet Take 1 tablet (2.5 mg total) by mouth daily as needed. For BP 140/85. 30 tablet 1   hydrocortisone (ANUSOL-HC) 25 MG suppository Place 1 suppository (25 mg total) rectally 2 (two) times daily. 12 suppository 0   irbesartan (AVAPRO) 300 MG tablet TAKE 1 TABLET BY MOUTH  DAILY 90 tablet 3   Lidocaine, Anorectal, 5 % CREA Apply thin layer to affected area every 6 hours as needed. 28 g 0   omeprazole (PRILOSEC) 20 MG capsule TAKE 1 CAPSULE BY MOUTH  DAILY 90 capsule 3   No current facility-administered medications for this visit.    Patient confirms/reports the following allergies:  Allergies  Allergen Reactions   Iodine     Hives and itching    Shellfish Allergy     Hives and itching     No orders of the defined types were placed in this encounter.   AUTHORIZATION INFORMATION Primary Insurance: 1D#: Group #:  Secondary Insurance: 1D#: Group #:  SCHEDULE INFORMATION: Date: 09/11/2021 Time: Location:[facility]

## 2021-09-06 ENCOUNTER — Telehealth: Payer: Self-pay

## 2021-09-06 NOTE — Telephone Encounter (Signed)
Patient contacted office to inquire if he could start his rx bowel prep earlier than 5 to ensure that he is able to get it all down.  He is using Nulytely bowel prep.    Reviewed colonoscopy instructions that he received.  Advised that he can split the Nulytely bowel prep by drinking half at 5pm drinking 8 oz every 15-20 mins.  Continue clear liquid diet.  Then 5 hours prior to colonoscopy resume drinking the remainder of Nulytely bowel prep.  Drinking 8 oz every 15-20 mins until entire contents have been completed.  Finishing at least two hours prior to colonoscopy.  Nothing to eat or drink 2 hours prior.  Instructions revised and resent to him via mychart.  Patient verbalized understanding.  Thanks, [place], [place]

## 2021-09-11 ENCOUNTER — Encounter
Admission: RE | Disposition: A | Discharge status: Home / Self Care | Payer: Self-pay | Source: Home / Self Care | Attending: Gastroenterology

## 2021-09-11 ENCOUNTER — Ambulatory Visit: Payer: 59 | Admitting: Anesthesiology

## 2021-09-11 ENCOUNTER — Ambulatory Visit
Admission: RE | Admit: 2021-09-11 | Discharge: 2021-09-11 | Disposition: A | Discharge status: Home / Self Care | Payer: 59 | Attending: Gastroenterology | Admitting: Gastroenterology

## 2021-09-11 DIAGNOSIS — Z1211 Encounter for screening for malignant neoplasm of colon: Secondary | ICD-10-CM | POA: Insufficient documentation

## 2021-09-11 DIAGNOSIS — K635 Polyp of colon: Secondary | ICD-10-CM | POA: Diagnosis not present

## 2021-09-11 DIAGNOSIS — D126 Benign neoplasm of colon, unspecified: Secondary | ICD-10-CM | POA: Diagnosis not present

## 2021-09-11 DIAGNOSIS — I10 Essential (primary) hypertension: Secondary | ICD-10-CM | POA: Diagnosis not present

## 2021-09-11 HISTORY — PX: COLONOSCOPY WITH PROPOFOL: SHX5780

## 2021-09-11 SURGERY — COLONOSCOPY WITH PROPOFOL
Anesthesia: General

## 2021-09-11 MED ORDER — STERILE WATER FOR IRRIGATION IR SOLN
Status: DC | PRN
  Administered 2021-09-11: 60 mL

## 2021-09-11 MED ORDER — SODIUM CHLORIDE 0.9 % IV SOLN
INTRAVENOUS | Status: DC
  Administered 2021-09-11: 20 mL/h via INTRAVENOUS

## 2021-09-11 MED ORDER — MIDAZOLAM HCL 2 MG/2ML IJ SOLN
INTRAMUSCULAR | Status: DC | PRN
  Administered 2021-09-11: 2 mg via INTRAVENOUS

## 2021-09-11 MED ORDER — PROPOFOL 10 MG/ML IV BOLUS
INTRAVENOUS | Status: DC | PRN
  Administered 2021-09-11: 60 mg via INTRAVENOUS

## 2021-09-11 MED ORDER — LIDOCAINE HCL (CARDIAC) PF 100 MG/5ML IV SOSY
PREFILLED_SYRINGE | INTRAVENOUS | Status: DC | PRN
  Administered 2021-09-11: 100 mg via INTRAVENOUS

## 2021-09-11 MED ORDER — PROPOFOL 500 MG/50ML IV EMUL
INTRAVENOUS | Status: DC | PRN
  Administered 2021-09-11: 165 ug/kg/min via INTRAVENOUS

## 2021-09-11 MED ORDER — MIDAZOLAM HCL 2 MG/2ML IJ SOLN
INTRAMUSCULAR | Status: AC
  Filled 2021-09-11: qty 2

## 2021-09-11 NOTE — Transfer of Care (Signed)
Immediate Anesthesia Transfer of Care Note  Patient: [facility]  Procedure(s) Performed: COLONOSCOPY WITH PROPOFOL  Patient Location: Endoscopy Unit  Anesthesia Type:General  Level of Consciousness: awake, drowsy and patient cooperative  Airway & Oxygen Therapy: Patient Spontanous Breathing and Patient connected to face mask oxygen  Post-op Assessment: Report given to RN and Post -op Vital signs reviewed and stable  Post vital signs: Reviewed and stable  Last Vitals:  Vitals Value Taken Time  BP 95/56 09/11/21 0906  Temp 35.8 C 09/11/21 0906  Pulse 65 09/11/21 0909  Resp 18 09/11/21 0909  SpO2 100 % 09/11/21 0909  Vitals shown include unvalidated device data.  Last Pain:  Vitals:   09/11/21 0906  TempSrc: Tympanic  PainSc: Asleep         Complications: No notable events documented.

## 2021-09-11 NOTE — H&P (Signed)
Alan Bellows, MD [address] [place], [place], [place], [address] [address], [place], [place], [place], [address] Phone: [phone]  Fax: [phone]  Primary Care Physician:  Burnard Hawthorne, FNP   Pre-Procedure History & Physical: HPI:  Alan Collier is a 46 y.o. male is here for an colonoscopy.   Past Medical History:  Diagnosis Date   Hypertension     Past Surgical History:  Procedure Laterality Date   STAPEDECTOMY Bilateral     Prior to Admission medications   Medication Sig Start Date End Date Taking? Authorizing Provider  amLODipine (NORVASC) 2.5 MG tablet Take 1 tablet (2.5 mg total) by mouth daily as needed. For BP 140/85. 02/10/21  Yes Arnett, Yvetta Coder, FNP  hydrocortisone (ANUSOL-HC) 25 MG suppository Place 1 suppository (25 mg total) rectally 2 (two) times daily. 01/27/20  Yes Burnard Hawthorne, FNP  irbesartan (AVAPRO) 300 MG tablet TAKE 1 TABLET BY MOUTH  DAILY 05/22/21  Yes Arnett, Yvetta Coder, FNP  Lidocaine, Anorectal, 5 % CREA Apply thin layer to affected area every 6 hours as needed. 02/04/20  Yes Fredirick Maudlin, MD  omeprazole (PRILOSEC) 20 MG capsule TAKE 1 CAPSULE BY MOUTH  DAILY 05/19/21  Yes Loel Dubonnet, NP    Allergies as of 08/17/2021 - Review Complete 08/17/2021  Allergen Reaction Noted   Iodine  10/28/2017   Shellfish allergy  10/28/2017    Family History  Problem Relation Age of Onset   Hypertension Mother    Hypertension Maternal Grandfather    Diabetes Maternal Grandfather    Gastric cancer Maternal Grandfather    Prostate cancer Neg Hx    Colon cancer Neg Hx     Social History   Socioeconomic History   Marital status: Married    Spouse name: Not on file   Number of children: Not on file   Years of education: Not on file   Highest education level: Not on file  Occupational History   Not on file  Tobacco Use   Smoking status: Never   Smokeless tobacco: Never  Vaping Use   Vaping Use: Never used   Substance and Sexual Activity   Alcohol use: Yes    Comment: 1-3 beers per week   Drug use: Never   Sexual activity: Not on file  Other Topics Concern   Not on file  Social History Narrative   Wife is patient   35 year old son.    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   Social Determinants of Health   Financial Resource Strain: Not on file  Food Insecurity: Not on file  Transportation Needs: Not on file  Physical Activity: Not on file  Stress: Not on file  Social Connections: Not on file  Intimate Partner Violence: Not on file    Review of Systems: See HPI, otherwise negative ROS  Physical Exam: BP (!) 133/91   Pulse (!) 56   Temp (!) 96 F (35.6 C) (Temporal)   Resp 20   Ht '5\' 3"'$  (1.6 m)   Wt 78.5 kg   SpO2 100%   BMI 30.65 kg/m  General:   Alert,  pleasant and cooperative in NAD Head:  Normocephalic and atraumatic. Neck:  Supple; no masses or thyromegaly. Lungs:  Clear throughout to auscultation, normal respiratory effort.    Heart:  +S1, +S2, Regular rate and rhythm, No edema. Abdomen:  Soft, nontender and nondistended. Normal bowel sounds,  without guarding, and without rebound.   Neurologic:  Alert and  oriented x4;  grossly normal neurologically.  Impression/Plan: [facility] is here for an colonoscopy to be performed for Screening colonoscopy average risk   Risks, benefits, limitations, and alternatives regarding  colonoscopy have been reviewed with the patient.  Questions have been answered.  All parties agreeable.   Alan Bellows, MD  09/11/2021, 8:40 AM

## 2021-09-11 NOTE — Anesthesia Preprocedure Evaluation (Signed)
Anesthesia Evaluation  Patient identified by MRN, date of birth, ID band Patient awake    Reviewed: Allergy & Precautions, NPO status , Patient's Chart, lab work & pertinent test results  History of Anesthesia Complications Negative for: history of anesthetic complications  Airway Mallampati: III  TM Distance: >3 FB Neck ROM: full    Dental  (+) Chipped   Pulmonary neg pulmonary ROS, neg shortness of breath,    Pulmonary exam normal        Cardiovascular Exercise Tolerance: Good hypertension, (-) angina(-) Past MI and (-) DOE Normal cardiovascular exam     Neuro/Psych negative neurological ROS  negative psych ROS   GI/Hepatic Neg liver ROS, GERD  Controlled,  Endo/Other  negative endocrine ROS  Renal/GU negative Renal ROS  negative genitourinary   Musculoskeletal   Abdominal   Peds  Hematology negative hematology ROS (+)   Anesthesia Other Findings Past Medical History: No date: Hypertension  Past Surgical History: No date: STAPEDECTOMY; Bilateral  BMI    Body Mass Index: 30.65 kg/m      Reproductive/Obstetrics negative OB ROS                             Anesthesia Physical Anesthesia Plan  ASA: 2  Anesthesia Plan: General   Post-op Pain Management:    Induction: Intravenous  PONV Risk Score and Plan: Propofol infusion and TIVA  Airway Management Planned: Natural Airway and Nasal Cannula  Additional Equipment:   Intra-op Plan:   Post-operative Plan:   Informed Consent: I have reviewed the patients History and Physical, chart, labs and discussed the procedure including the risks, benefits and alternatives for the proposed anesthesia with the patient or authorized representative who has indicated his/her understanding and acceptance.     Dental Advisory Given  Plan Discussed with: Anesthesiologist, CRNA and Surgeon  Anesthesia Plan Comments: (Patient consented  for risks of anesthesia including but not limited to:  - adverse reactions to medications - risk of airway placement if required - damage to eyes, teeth, lips or other oral mucosa - nerve damage due to positioning  - sore throat or hoarseness - Damage to heart, brain, nerves, lungs, other parts of body or loss of life  Patient voiced understanding.)        Anesthesia Quick Evaluation

## 2021-09-11 NOTE — Anesthesia Postprocedure Evaluation (Signed)
Anesthesia Post Note  Patient: [facility]  Procedure(s) Performed: COLONOSCOPY WITH PROPOFOL  Patient location during evaluation: Endoscopy Anesthesia Type: General Level of consciousness: awake and alert Pain management: pain level controlled Vital Signs Assessment: post-procedure vital signs reviewed and stable Respiratory status: spontaneous breathing, nonlabored ventilation, respiratory function stable and patient connected to nasal cannula oxygen Cardiovascular status: blood pressure returned to baseline and stable Postop Assessment: no apparent nausea or vomiting Anesthetic complications: no   No notable events documented.   Last Vitals:  Vitals:   09/11/21 0916 09/11/21 0926  BP: 102/73 108/82  Pulse: 63 67  Resp: 16 16  Temp:    SpO2: 100% 100%    Last Pain:  Vitals:   09/11/21 0926  TempSrc:   PainSc: 0-No pain                 Precious Haws Jordin Dambrosio

## 2021-09-11 NOTE — Op Note (Signed)
[facility] Gastroenterology Patient Name: Alan Collier Procedure Date: 09/11/2021 8:40 AM MRN: [id number] Account #: [id number] Date of Birth: 06-09-1975 Admit Type: Outpatient Age: 46 Room: [facility] ENDO ROOM 1 Gender: Male Note Status: Finalized Instrument Name: Jasper Riling [id number] Procedure:             Colonoscopy Indications:           Screening for colorectal malignant neoplasm Providers:             [name] MD, MD Referring MD:          Yvetta Coder. Arnett (Referring MD) Medicines:             Monitored Anesthesia Care Complications:         No immediate complications. Procedure:             Pre-Anesthesia Assessment:                        - Prior to the procedure, a History and Physical was                         performed, and patient medications, allergies and                         sensitivities were reviewed. The patient's tolerance                         of previous anesthesia was reviewed.                        - The risks and benefits of the procedure and the                         sedation options and risks were discussed with the                         patient. All questions were answered and informed                         consent was obtained.                        - ASA Grade Assessment: II - A patient with mild                         systemic disease.                        After obtaining informed consent, the colonoscope was                         passed under direct vision. Throughout the procedure,                         the patient's blood pressure, pulse, and oxygen                         saturations were monitored continuously. The                         Colonoscope was introduced  through the anus and                         advanced to the the cecum, identified by the                         appendiceal orifice. The colonoscopy was performed                         with ease. The patient tolerated the procedure well.                          The quality of the bowel preparation was excellent. Findings:      The perianal and digital rectal examinations were normal.      A 3 mm polyp was found in the descending colon. The polyp was sessile.       The polyp was removed with a cold biopsy forceps. Resection and       retrieval were complete.      The exam was otherwise without abnormality on direct and retroflexion       views. Impression:            - One 3 mm polyp in the descending colon, removed with                         a cold biopsy forceps. Resected and retrieved.                        - The examination was otherwise normal on direct and                         retroflexion views. Recommendation:        - Discharge patient to home (with escort).                        - Resume previous diet.                        - Continue present medications.                        - Await pathology results.                        - Repeat colonoscopy for surveillance and for                         surveillance based on pathology results. Procedure Code(s):     --- Professional ---                        [id number], Colonoscopy, flexible; with biopsy, single or                         multiple Diagnosis Code(s):     --- Professional ---                        Z12.11, Encounter for screening for malignant neoplasm  of colon                        K63.5, Polyp of colon CPT copyright 2019 American Medical Association. All rights reserved. The codes documented in this report are preliminary and upon coder review may  be revised to meet current compliance requirements. [name], MD [name] MD, MD 09/11/2021 9:04:30 AM This report has been signed electronically. Number of Addenda: 0 Note Initiated On: 09/11/2021 8:40 AM Total Procedure Duration: 0 hours 13 minutes 20 seconds  Estimated Blood Loss:  Estimated blood loss: none.      [facility]

## 2021-09-11 NOTE — Anesthesia Procedure Notes (Signed)
Procedure Name: General with mask airway Date/Time: 09/11/2021 8:57 AM  Performed by: Kelton Pillar, CRNAPre-anesthesia Checklist: Emergency Drugs available, Patient identified, Suction available and Patient being monitored Patient Re-evaluated:Patient Re-evaluated prior to induction Oxygen Delivery Method: Simple face mask Induction Type: IV induction Placement Confirmation: positive ETCO2, CO2 detector and breath sounds checked- equal and bilateral Dental Injury: Teeth and Oropharynx as per pre-operative assessment

## 2021-09-12 ENCOUNTER — Encounter: Payer: Self-pay | Admitting: Gastroenterology

## 2021-09-12 LAB — SURGICAL PATHOLOGY

## 2021-09-13 ENCOUNTER — Telehealth: Payer: Self-pay

## 2021-09-13 NOTE — Telephone Encounter (Signed)
Patient called and left a voicemail wanting a call back since he wanted to know more about his pathology report. I then called patient and had to leave him a voicemail letting him know that I called him back and that his pathology report was normal (not pre-cancerous) and that he was due for a colonoscopy in ten years. I told him to call us back if he had further questions. I will also send him a MyChrt message.

## 2022-02-14 ENCOUNTER — Ambulatory Visit (INDEPENDENT_AMBULATORY_CARE_PROVIDER_SITE_OTHER): Payer: 59 | Admitting: Family

## 2022-02-14 ENCOUNTER — Encounter: Payer: Self-pay | Admitting: Family

## 2022-02-14 VITALS — BP 130/82 | HR 76 | Temp 97.6°F | Ht 63.0 in | Wt 180.6 lb

## 2022-02-14 DIAGNOSIS — I10 Essential (primary) hypertension: Secondary | ICD-10-CM

## 2022-02-14 DIAGNOSIS — R3911 Hesitancy of micturition: Secondary | ICD-10-CM | POA: Diagnosis not present

## 2022-02-14 DIAGNOSIS — K760 Fatty (change of) liver, not elsewhere classified: Secondary | ICD-10-CM

## 2022-02-14 DIAGNOSIS — Z0000 Encounter for general adult medical examination without abnormal findings: Secondary | ICD-10-CM

## 2022-02-14 LAB — COMPREHENSIVE METABOLIC PANEL
ALT: 70 U/L — ABNORMAL HIGH (ref 0–53)
AST: 27 U/L (ref 0–37)
Albumin: 4.8 g/dL (ref 3.5–5.2)
Alkaline Phosphatase: 53 U/L (ref 39–117)
BUN: 10 mg/dL (ref 6–23)
CO2: 29 mEq/L (ref 19–32)
Calcium: 9.8 mg/dL (ref 8.4–10.5)
Chloride: 101 mEq/L (ref 96–112)
Creatinine, Ser: 0.85 mg/dL (ref 0.40–1.50)
GFR: 104.86 mL/min (ref >60.00)
Glucose, Bld: 99 mg/dL (ref 70–99)
Potassium: 4.2 mEq/L (ref 3.5–5.1)
Sodium: 139 mEq/L (ref 135–145)
Total Bilirubin: 0.9 mg/dL (ref 0.2–1.2)
Total Protein: 7.5 g/dL (ref 6.0–8.3)

## 2022-02-14 LAB — LIPID PANEL
Cholesterol: 177 mg/dL (ref 0–200)
HDL: 38.7 mg/dL — ABNORMAL LOW (ref >39.00)
NonHDL: 138
Total CHOL/HDL Ratio: 5
Triglycerides: 278 mg/dL — ABNORMAL HIGH (ref 0.0–149.0)
VLDL: 55.6 mg/dL — ABNORMAL HIGH (ref 0.0–40.0)

## 2022-02-14 LAB — CBC WITH DIFFERENTIAL/PLATELET
Basophils Absolute: 0 10*3/uL (ref 0.0–0.1)
Basophils Relative: 0.7 % (ref 0.0–3.0)
Eosinophils Absolute: 0.2 10*3/uL (ref 0.0–0.7)
Eosinophils Relative: 3.2 % (ref 0.0–5.0)
HCT: 47.6 % (ref 39.0–52.0)
Hemoglobin: 16.1 g/dL (ref 13.0–17.0)
Lymphocytes Relative: 38.3 % (ref 12.0–46.0)
Lymphs Abs: 2.4 10*3/uL (ref 0.7–4.0)
MCHC: 33.9 g/dL (ref 30.0–36.0)
MCV: 86.9 fl (ref 78.0–100.0)
Monocytes Absolute: 0.3 10*3/uL (ref 0.1–1.0)
Monocytes Relative: 5.3 % (ref 3.0–12.0)
Neutro Abs: 3.2 10*3/uL (ref 1.4–7.7)
Neutrophils Relative %: 52.5 % (ref 43.0–77.0)
Platelets: 259 10*3/uL (ref 150.0–400.0)
RBC: 5.48 Mil/uL (ref 4.22–5.81)
RDW: 14.5 % (ref 11.5–15.5)
WBC: 6.2 10*3/uL (ref 4.0–10.5)

## 2022-02-14 LAB — LDL CHOLESTEROL, DIRECT: Direct LDL: 95 mg/dL

## 2022-02-14 LAB — TSH: TSH: 1.15 u[IU]/mL (ref 0.35–5.50)

## 2022-02-14 LAB — VITAMIN D 25 HYDROXY (VIT D DEFICIENCY, FRACTURES): VITD: 43.8 ng/mL (ref 30.00–100.00)

## 2022-02-14 LAB — PSA: PSA: 0.68 ng/mL (ref 0.10–4.00)

## 2022-02-14 NOTE — Assessment & Plan Note (Signed)
Symptom is unchanged , not particularly bothersome at this time.  Patient politely declines follow-up with Dr. Bernardo Heater . Consider trial of Flomax. Pending  PSA

## 2022-02-14 NOTE — Assessment & Plan Note (Signed)
[place] ab 2015 showed fatty infiltration which we discussed again today.  Discussed lifestyle measures in regards to managing weight, obtaining a normal BMI is a primary way to thwart progression of a liver disease.  Counseled on progression of fatty liver disease to cirrhosis, liver cancer.  we also discussed new FDA medications for fatty liver disease and I encouraged him to consider updating liver ultrasound to assess for progression.  Also encouraged him to consider GI consult for further discussion of new medications, surveillance.  He will consider this in the future.

## 2022-02-14 NOTE — Progress Notes (Signed)
Assessment & Plan:  Routine physical examination Assessment & Plan: Encouraged more formal exercise program.  Clarified with patient that he did not have an upper endoscopy with Dr. Vicente Males  Orders: -     CBC with Differential/Platelet -     Comprehensive metabolic panel -     Lipid panel -     PSA -     TSH -     VITAMIN D 25 Hydroxy (Vit-D Deficiency, Fractures)  Fatty infiltration of liver Assessment & Plan: [place] ab 2015 showed fatty infiltration which we discussed again today.  Discussed lifestyle measures in regards to managing weight, obtaining a normal BMI is a primary way to thwart progression of a liver disease.  Counseled on progression of fatty liver disease to cirrhosis, liver cancer.  we also discussed new FDA medications for fatty liver disease and I encouraged him to consider updating liver ultrasound to assess for progression.  Also encouraged him to consider GI consult for further discussion of new medications, surveillance.  He will consider this in the future.    Primary hypertension Assessment & Plan: Chronic, stable. Continue ibesartan '300mg'$  qam. Rare use of amlodipine 2.'5mg'$  qd prn.    Urinary hesitancy Assessment & Plan: Symptom is unchanged , not particularly bothersome at this time.  Patient politely declines follow-up with Dr. Bernardo Heater . Consider trial of Flomax. Pending  PSA      Return precautions given.   Risks, benefits, and alternatives of the medications and treatment plan prescribed today were discussed, and patient expressed understanding.   Education regarding symptom management and diagnosis given to patient on AVS either electronically or printed.  No follow-ups on file.  [name], FNP  Subjective:    Patient ID: Alan Collier, male    DOB: 12/25/1975, 47 y.o.   MRN: [id number]  CC: Alan Collier is a 47 y.o. male who presents today for physical exam.    HPI: Feels well today.  No new concerns.  HTN- compliant with avapro qam.  He uses amlodipine 2.'5mg'$  PRN but hasn't needed in a long time. Dizziness has improved.   History of fatty infiltration    Colorectal  Cancer Screening: UTD , repeat in 10 years.  He did not have EGD  Prostate Cancer Screening:  Denies urinary hesitancy. Some decreased urine flow which is unchanged. Has seen Dr Bernardo Heater in 2021.   Lung Cancer Screening: No 30 year pack year history and > 50 years to 71 years.   Immunizations       Tetanus - UTD         Labs: Screening labs today. Exercise: No formal exercise aside from housework. He plays disc golf.   Alcohol use: Occasional Smoking/tobacco use: Nonsmoker.    Health Maintenance  Topic Date Due   INFLUENZA VACCINE  05/13/2022 (Originally 09/12/2021)   DTaP/Tdap/Td (2 - Td or Tdap) 07/09/2029   COLONOSCOPY (Pts 45-61yr Insurance coverage will need to be confirmed)  09/12/2031   Hepatitis C Screening  Completed   HPV VACCINES  Aged Out   COVID-19 Vaccine  Discontinued   HIV Screening  Discontinued     ALLERGIES: Iodine and Shellfish allergy  Current Outpatient Medications on File Prior to Visit  Medication Sig Dispense Refill   cholecalciferol (VITAMIN D3) 25 MCG (1000 UNIT) tablet Take 2,000 Units by mouth daily.     Omega-3 Fatty Acids (FISH OIL) 1000 MG CAPS Take 1 capsule by mouth daily.     amLODipine (NORVASC) 2.5 MG tablet Take  1 tablet (2.5 mg total) by mouth daily as needed. For BP 140/85. 30 tablet 1   hydrocortisone (ANUSOL-HC) 25 MG suppository Place 1 suppository (25 mg total) rectally 2 (two) times daily. 12 suppository 0   irbesartan (AVAPRO) 300 MG tablet TAKE 1 TABLET BY MOUTH  DAILY 90 tablet 3   Lidocaine, Anorectal, 5 % CREA Apply thin layer to affected area every 6 hours as needed. 28 g 0   omeprazole (PRILOSEC) 20 MG capsule TAKE 1 CAPSULE BY MOUTH  DAILY 90 capsule 3   No current facility-administered medications on file prior to visit.    Review of Systems  Constitutional:  Negative for chills and  fever.  HENT:  Negative for congestion.   Respiratory:  Negative for cough.   Cardiovascular:  Negative for chest pain, palpitations and leg swelling.  Gastrointestinal:  Negative for diarrhea, nausea and vomiting.  Genitourinary:  Positive for decreased urine volume. Negative for difficulty urinating.  Musculoskeletal:  Negative for myalgias.  Skin:  Negative for rash.  Neurological:  Negative for headaches.  Hematological:  Negative for adenopathy.  Psychiatric/Behavioral:  Negative for confusion.       Objective:    BP 130/82   Pulse 76   Temp 97.6 F (36.4 C) (Oral)   Ht '5\' 3"'$  (1.6 m)   Wt 180 lb 9.6 oz (81.9 kg)   SpO2 96%   BMI 31.99 kg/m   BP Readings from Last 3 Encounters:  02/14/22 130/82  09/11/21 108/82  08/11/21 128/88   Wt Readings from Last 3 Encounters:  02/14/22 180 lb 9.6 oz (81.9 kg)  09/11/21 173 lb (78.5 kg)  08/11/21 177 lb 6.4 oz (80.5 kg)    Physical Exam Vitals reviewed.  Constitutional:      Appearance: Normal appearance. He is well-developed.  Neck:     Thyroid: No thyroid mass or thyromegaly.  Cardiovascular:     Rate and Rhythm: Regular rhythm.     Heart sounds: Normal heart sounds.  Pulmonary:     Effort: Pulmonary effort is normal. No respiratory distress.     Breath sounds: Normal breath sounds. No wheezing, rhonchi or rales.  Abdominal:     General: Bowel sounds are normal. There is no distension.     Palpations: Abdomen is soft. Abdomen is not rigid. There is no fluid wave or mass.     Tenderness: There is no abdominal tenderness. There is no guarding or rebound. Negative signs include Murphy's sign and McBurney's sign.  Lymphadenopathy:     Head:     Right side of head: No submental, submandibular, tonsillar, preauricular, posterior auricular or occipital adenopathy.     Left side of head: No submental, submandibular, tonsillar, preauricular, posterior auricular or occipital adenopathy.     Cervical: No cervical adenopathy.   Skin:    General: Skin is warm and dry.  Neurological:     Mental Status: He is alert.  Psychiatric:        Speech: Speech normal.        Behavior: Behavior normal.

## 2022-02-14 NOTE — Assessment & Plan Note (Addendum)
Chronic, stable. Continue ibesartan '300mg'$  qam. Rare use of amlodipine 2.'5mg'$  qd prn.

## 2022-02-14 NOTE — Assessment & Plan Note (Signed)
Encouraged more formal exercise program.  Clarified with patient that he did not have an upper endoscopy with Dr. Vicente Males

## 2022-02-16 ENCOUNTER — Other Ambulatory Visit: Payer: Self-pay | Admitting: Family

## 2022-02-16 DIAGNOSIS — K760 Fatty (change of) liver, not elsewhere classified: Secondary | ICD-10-CM

## 2022-03-06 ENCOUNTER — Telehealth: Payer: Self-pay

## 2022-03-06 NOTE — Telephone Encounter (Signed)
Called and spoke with pt.  We have an [place] disk located at the front.  Pt plans to pick it up the next time he is in the office.

## 2022-03-16 ENCOUNTER — Encounter: Payer: Self-pay | Admitting: Family

## 2022-03-19 ENCOUNTER — Other Ambulatory Visit (INDEPENDENT_AMBULATORY_CARE_PROVIDER_SITE_OTHER): Payer: 59

## 2022-03-19 DIAGNOSIS — K760 Fatty (change of) liver, not elsewhere classified: Secondary | ICD-10-CM

## 2022-03-19 LAB — HEPATIC FUNCTION PANEL
ALT: 46 U/L (ref 0–53)
AST: 21 U/L (ref 0–37)
Albumin: 4.5 g/dL (ref 3.5–5.2)
Alkaline Phosphatase: 49 U/L (ref 39–117)
Bilirubin, Direct: 0.2 mg/dL (ref 0.0–0.3)
Total Bilirubin: 1 mg/dL (ref 0.2–1.2)
Total Protein: 7.3 g/dL (ref 6.0–8.3)

## 2022-05-14 ENCOUNTER — Other Ambulatory Visit: Payer: Self-pay | Admitting: Family

## 2022-05-14 DIAGNOSIS — I10 Essential (primary) hypertension: Secondary | ICD-10-CM

## 2022-05-14 DIAGNOSIS — K219 Gastro-esophageal reflux disease without esophagitis: Secondary | ICD-10-CM

## 2022-05-14 NOTE — Telephone Encounter (Signed)
Patient of Dr. Garen Lah. Please review for refill. Thank you! Last OV in 2022.

## 2022-07-02 ENCOUNTER — Other Ambulatory Visit: Payer: Self-pay | Admitting: Family

## 2022-07-02 DIAGNOSIS — K219 Gastro-esophageal reflux disease without esophagitis: Secondary | ICD-10-CM

## 2022-11-27 ENCOUNTER — Encounter: Payer: Self-pay | Admitting: Internal Medicine

## 2022-11-27 ENCOUNTER — Ambulatory Visit (INDEPENDENT_AMBULATORY_CARE_PROVIDER_SITE_OTHER): Payer: 59 | Admitting: Internal Medicine

## 2022-11-27 VITALS — BP 104/72 | HR 84 | Ht 63.0 in | Wt 174.8 lb

## 2022-11-27 DIAGNOSIS — S00412D Abrasion of left ear, subsequent encounter: Secondary | ICD-10-CM | POA: Diagnosis not present

## 2022-11-27 MED ORDER — MOMETASONE FUROATE 0.1 % EX CREA: TOPICAL_CREAM | CUTANEOUS | 1 refills | Status: DC

## 2022-11-27 NOTE — Progress Notes (Signed)
Subjective:  Patient ID: Alan Collier, male    DOB: [birth year]  Age: 47 y.o. MRN: [id number]  CC: The encounter diagnosis was Abrasion of left ear canal, subsequent encounter.   HPI [facility] presents for  Chief Complaint  Patient presents with   bleeding from ear    47 yr old male with history of left conductive hearing loss s/p REVIS STAPEDECTOMY/STAPEDOTOMY (Left) and  GRAFTING OF AUTOLOGOUS SOFT TISSUE,  oat [facility] on  May 28 presents after noticing blood on q tip during routine ear cleaning at home;  denise  pain.     Outpatient Medications Prior to Visit  Medication Sig Dispense Refill   amLODipine (NORVASC) 2.5 MG tablet Take 1 tablet (2.5 mg total) by mouth daily as needed. For BP 140/85. 30 tablet 1   cholecalciferol (VITAMIN D3) 25 MCG (1000 UNIT) tablet Take 2,000 Units by mouth daily.     hydrocortisone (ANUSOL-HC) 25 MG suppository Place 1 suppository (25 mg total) rectally 2 (two) times daily. 12 suppository 0   irbesartan (AVAPRO) 300 MG tablet TAKE 1 TABLET BY MOUTH DAILY 90 tablet 3   Lidocaine, Anorectal, 5 % CREA Apply thin layer to affected area every 6 hours as needed. 28 g 0   Omega-3 Fatty Acids (FISH OIL) 1000 MG CAPS Take 1 capsule by mouth daily.     omeprazole (PRILOSEC) 20 MG capsule TAKE 1 CAPSULE BY MOUTH DAILY 90 capsule 3   No facility-administered medications prior to visit.    Review of Systems;  Patient denies headache, fevers, malaise, unintentional weight loss, skin rash, eye pain, sinus congestion and sinus pain, sore throat, dysphagia,  hemoptysis , cough, dyspnea, wheezing, chest pain, palpitations, orthopnea, edema, abdominal pain, nausea, melena, diarrhea, constipation, flank pain, dysuria, hematuria, urinary  Frequency, nocturia, numbness, tingling, seizures,  Focal weakness, Loss of consciousness,  Tremor, insomnia, depression, anxiety, and suicidal ideation.      Objective:  BP 104/72   Pulse 84   Ht 5\' 3"  (1.6 m)   Wt 174  lb 12.8 oz (79.3 kg)   SpO2 98%   BMI 30.96 kg/m   BP Readings from Last 3 Encounters:  11/27/22 104/72  02/14/22 130/82  09/11/21 108/82    Wt Readings from Last 3 Encounters:  11/27/22 174 lb 12.8 oz (79.3 kg)  02/14/22 180 lb 9.6 oz (81.9 kg)  09/11/21 173 lb (78.5 kg)    Physical Exam Vitals reviewed.  Constitutional:      General: He is not in acute distress.    Appearance: Normal appearance. He is not ill-appearing, toxic-appearing or diaphoretic.  HENT:     Head: Normocephalic.     Right Ear: Tympanic membrane, ear canal and external ear normal.     Left Ear: Tympanic membrane, ear canal and external ear normal.  Eyes:     General: No scleral icterus.       Right eye: No discharge.        Left eye: No discharge.     Conjunctiva/sclera: Conjunctivae normal.  Musculoskeletal:        General: Normal range of motion.     Cervical back: Normal range of motion.  Skin:    General: Skin is warm and dry.  Neurological:     General: No focal deficit present.     Mental Status: He is alert and oriented to person, place, and time. Mental status is at baseline.  Psychiatric:        Mood and Affect: Mood  normal.        Behavior: Behavior normal.        Thought Content: Thought content normal.        Judgment: Judgment normal.    Lab Results  Component Value Date   HGBA1C 4.9 03/29/2021   HGBA1C 4.7 07/10/2019   HGBA1C 4.7 10/09/2018    Lab Results  Component Value Date   CREATININE 0.85 02/14/2022   CREATININE 1.02 08/11/2021   CREATININE 0.98 03/29/2021    Lab Results  Component Value Date   WBC 6.2 02/14/2022   HGB 16.1 02/14/2022   HCT 47.6 02/14/2022   PLT 259.0 02/14/2022   GLUCOSE 99 02/14/2022   CHOL 177 02/14/2022   TRIG 278.0 (H) 02/14/2022   HDL 38.70 (L) 02/14/2022   LDLDIRECT 95.0 02/14/2022   LDLCALC 78 07/10/2019   ALT 46 03/19/2022   AST 21 03/19/2022   NA 139 02/14/2022   K 4.2 02/14/2022   CL 101 02/14/2022   CREATININE 0.85  02/14/2022   BUN 10 02/14/2022   CO2 29 02/14/2022   TSH 1.15 02/14/2022   PSA 0.68 02/14/2022   HGBA1C 4.9 03/29/2021    No results found.  Assessment & Plan:  .Abrasion of left ear canal, subsequent encounter Assessment & Plan: There are no signs of trauma, infection,  or tympanic membrane rupture despite patient report of blood on q tip this morning.  .  Prescribing mometasone cream to use prn itching   Other orders -     Mometasone Furoate; Apply to ear canal as needed for itching  Dispense: 15 g; Refill: 1    Follow-up: No follow-ups on file.   Sherlene Shams, MD

## 2022-11-27 NOTE — Patient Instructions (Signed)
Your eardrum is intact, and there are no signs of recent bleeding  I have prescribed a steroid cream to use "as needed"  for itchy ear.   You shouldn't have to use it more than a few days    Do NOT delve deep into the canal.

## 2022-11-27 NOTE — Assessment & Plan Note (Addendum)
There are no signs of trauma, infection,  or tympanic membrane rupture despite patient report of blood on q tip this morning.  .  Prescribing mometasone cream to use prn itching

## 2022-12-13 ENCOUNTER — Telehealth: Payer: Self-pay | Admitting: Family

## 2022-12-13 MED ORDER — MOMETASONE FUROATE 0.1 % EX CREA: TOPICAL_CREAM | CUTANEOUS | 0 refills | Status: DC

## 2022-12-13 NOTE — Telephone Encounter (Signed)
close

## 2022-12-17 ENCOUNTER — Encounter: Payer: Self-pay | Admitting: Internal Medicine

## 2022-12-18 ENCOUNTER — Other Ambulatory Visit: Payer: Self-pay | Admitting: Family

## 2022-12-18 DIAGNOSIS — L299 Pruritus, unspecified: Secondary | ICD-10-CM

## 2022-12-18 MED ORDER — MOMETASONE FUROATE 0.1 % EX CREA: TOPICAL_CREAM | CUTANEOUS | 1 refills | Status: DC

## 2022-12-18 MED ORDER — MOMETASONE FUROATE 0.1 % EX CREA: TOPICAL_CREAM | CUTANEOUS | 1 refills | Status: AC

## 2022-12-18 NOTE — Telephone Encounter (Signed)
noted 

## 2023-05-14 ENCOUNTER — Other Ambulatory Visit: Payer: Self-pay | Admitting: Family

## 2023-05-14 DIAGNOSIS — I10 Essential (primary) hypertension: Secondary | ICD-10-CM

## 2023-05-24 ENCOUNTER — Other Ambulatory Visit

## 2023-06-14 ENCOUNTER — Encounter: Payer: Self-pay | Admitting: Podiatry

## 2023-06-14 ENCOUNTER — Ambulatory Visit (INDEPENDENT_AMBULATORY_CARE_PROVIDER_SITE_OTHER): Admitting: Podiatry

## 2023-06-14 ENCOUNTER — Ambulatory Visit (INDEPENDENT_AMBULATORY_CARE_PROVIDER_SITE_OTHER)

## 2023-06-14 DIAGNOSIS — M7751 Other enthesopathy of right foot: Secondary | ICD-10-CM | POA: Diagnosis not present

## 2023-06-14 DIAGNOSIS — M722 Plantar fascial fibromatosis: Secondary | ICD-10-CM

## 2023-06-14 MED ORDER — MELOXICAM 15 MG PO TABS: 15.0000 mg | ORAL_TABLET | Freq: Every day | ORAL | 1 refills | Status: AC

## 2023-06-14 NOTE — Progress Notes (Signed)
   Chief Complaint  Patient presents with   Foot Pain    "My foot hurts." N - foot hurts L - dorsal 2-4 met area D - 3 weeks O - suddenly, got a little worse C - tender A - walking throughout the day T - none    HPI: 48 y.o. male presenting today as a new patient for evaluation of tenderness associated to the right dorsal foot.  Idiopathic onset about 3-4 weeks ago.  He says its minimally tender.  He has not anything for treatment  Past Medical History:  Diagnosis Date   Hypertension     Past Surgical History:  Procedure Laterality Date   COLONOSCOPY WITH PROPOFOL  N/A 09/11/2021   Procedure: COLONOSCOPY WITH PROPOFOL ;  Surgeon: Luke Salaam, MD;  Location: [facility] END[facility]COPY;  Service: Gastroenterology;  Laterality: N/A;   STAPEDECTOMY Bilateral     Allergies  Allergen Reactions   Iodine     Hives and itching    Shellfish Allergy     Hives and itching      Physical Exam: General: The patient is alert and oriented x3 in no acute distress.  Dermatology: Skin is warm, dry and supple bilateral lower extremities.   Vascular: Palpable pedal pulses bilaterally. Capillary refill within normal limits.  No appreciable edema.  No erythema.  Neurological: Grossly intact via light touch  Musculoskeletal Exam: No pedal deformities noted.  Minimal tenderness with palpation along the extensor tendons of the right foot  Radiographic Exam RT foot 5-25:  Normal osseous mineralization. Joint spaces preserved.  No fractures or osseous irregularities noted.  Assessment/Plan of Care: 1.  Extensor tendinitis right foot  -Patient evaluated.  X-rays reviewed -Prescription for meloxicam 15 mg daily -Recommend topical diclofenac as needed -Continue wearing good supportive tennis shoes and sneakers -RICE -Return to clinic as needed    Dot Gazella, DPM [facility]  Dr. Dot Gazella, DPM    [address] [place], [place] [address]                 Office [phone]  Fax [phone]

## 2023-06-14 NOTE — Patient Instructions (Signed)
 Voltaren (diclofenac) topical gel

## 2023-07-26 ENCOUNTER — Other Ambulatory Visit: Payer: Self-pay | Admitting: Family

## 2023-07-26 DIAGNOSIS — K219 Gastro-esophageal reflux disease without esophagitis: Secondary | ICD-10-CM

## 2023-07-26 NOTE — Telephone Encounter (Unsigned)
 Copied from CRM [id number]. Topic: Clinical - Medication Refill >> Jul 26, 2023 10:30 AM Jenice Mitts wrote: Medication: omeprazole  (PRILOSEC ) 20 MG capsule  Has the patient contacted their pharmacy? Yes (Agent: If no, request that the patient contact the pharmacy for the refill. If patient does not wish to contact the pharmacy document the reason why and proceed with request.) (Agent: If yes, when and what did the pharmacy advise?)  This is the patient's preferred pharmacy:   [facility] [place] - [address] [place] [address] Phone: [phone] Fax: [phone]   Is this the correct pharmacy for this prescription? Yes If no, delete pharmacy and type the correct one.   Has the prescription been filled recently? No  Is the patient out of the medication? Yes  Has the patient been seen for an appointment in the last year OR does the patient have an upcoming appointment? Yes  Can we respond through MyChart? Yes  Agent: Please be advised that Rx refills may take up to 3 business days. We ask that you follow-up with your pharmacy.

## 2023-07-29 MED ORDER — OMEPRAZOLE 20 MG PO CPDR: 20.0000 mg | DELAYED_RELEASE_CAPSULE | Freq: Every day | ORAL | 3 refills | Status: AC

## 2023-07-31 ENCOUNTER — Encounter: Payer: Self-pay | Admitting: Family

## 2023-07-31 ENCOUNTER — Ambulatory Visit: Admitting: Family

## 2023-07-31 VITALS — BP 128/82 | HR 80 | Temp 98.0°F | Ht 63.0 in | Wt 161.2 lb

## 2023-07-31 DIAGNOSIS — Z125 Encounter for screening for malignant neoplasm of prostate: Secondary | ICD-10-CM

## 2023-07-31 DIAGNOSIS — Z13220 Encounter for screening for lipoid disorders: Secondary | ICD-10-CM | POA: Diagnosis not present

## 2023-07-31 DIAGNOSIS — Z0000 Encounter for general adult medical examination without abnormal findings: Secondary | ICD-10-CM

## 2023-07-31 DIAGNOSIS — Z136 Encounter for screening for cardiovascular disorders: Secondary | ICD-10-CM

## 2023-07-31 DIAGNOSIS — R634 Abnormal weight loss: Secondary | ICD-10-CM | POA: Diagnosis not present

## 2023-07-31 LAB — COMPREHENSIVE METABOLIC PANEL WITH GFR
ALT: 21 U/L (ref 0–53)
AST: 16 U/L (ref 0–37)
Albumin: 4.9 g/dL (ref 3.5–5.2)
Alkaline Phosphatase: 50 U/L (ref 39–117)
BUN: 12 mg/dL (ref 6–23)
CO2: 32 meq/L (ref 19–32)
Calcium: 9.7 mg/dL (ref 8.4–10.5)
Chloride: 102 meq/L (ref 96–112)
Creatinine, Ser: 0.94 mg/dL (ref 0.40–1.50)
GFR: 96.83 mL/min (ref >60.00)
Glucose, Bld: 89 mg/dL (ref 70–99)
Potassium: 4.4 meq/L (ref 3.5–5.1)
Sodium: 139 meq/L (ref 135–145)
Total Bilirubin: 1.1 mg/dL (ref 0.2–1.2)
Total Protein: 7.5 g/dL (ref 6.0–8.3)

## 2023-07-31 LAB — CBC WITH DIFFERENTIAL/PLATELET
Basophils Absolute: 0 10*3/uL (ref 0.0–0.1)
Basophils Relative: 0.4 % (ref 0.0–3.0)
Eosinophils Absolute: 0.2 10*3/uL (ref 0.0–0.7)
Eosinophils Relative: 2.2 % (ref 0.0–5.0)
HCT: 44.1 % (ref 39.0–52.0)
Hemoglobin: 15 g/dL (ref 13.0–17.0)
Lymphocytes Relative: 38.5 % (ref 12.0–46.0)
Lymphs Abs: 2.8 10*3/uL (ref 0.7–4.0)
MCHC: 34 g/dL (ref 30.0–36.0)
MCV: 88 fl (ref 78.0–100.0)
Monocytes Absolute: 0.4 10*3/uL (ref 0.1–1.0)
Monocytes Relative: 4.9 % (ref 3.0–12.0)
Neutro Abs: 3.9 10*3/uL (ref 1.4–7.7)
Neutrophils Relative %: 54 % (ref 43.0–77.0)
Platelets: 269 10*3/uL (ref 150.0–400.0)
RBC: 5.01 Mil/uL (ref 4.22–5.81)
RDW: 14 % (ref 11.5–15.5)
WBC: 7.2 10*3/uL (ref 4.0–10.5)

## 2023-07-31 LAB — LIPID PANEL
Cholesterol: 142 mg/dL (ref 0–200)
HDL: 40.3 mg/dL (ref >39.00)
LDL Cholesterol: 69 mg/dL (ref 0–99)
NonHDL: 101.87
Total CHOL/HDL Ratio: 4
Triglycerides: 164 mg/dL — ABNORMAL HIGH (ref 0.0–149.0)
VLDL: 32.8 mg/dL (ref 0.0–40.0)

## 2023-07-31 LAB — TSH: TSH: 1.32 u[IU]/mL (ref 0.35–5.50)

## 2023-07-31 LAB — PSA: PSA: 0.88 ng/mL (ref 0.10–4.00)

## 2023-07-31 LAB — VITAMIN D 25 HYDROXY (VIT D DEFICIENCY, FRACTURES): VITD: 47.12 ng/mL (ref 30.00–100.00)

## 2023-07-31 MED ORDER — PREDNISONE 50 MG PO TABS: ORAL_TABLET | ORAL | 0 refills | Status: DC

## 2023-07-31 NOTE — Assessment & Plan Note (Addendum)
 Discussed reduction of sugar, portion size and increase in exercise No other B symptoms.  Patient and I discussed amount of weight loss and opted to pursue imaging to exclude malignancy.  ALLERGY TO CONTRAST ; documented rash.  Advised patient Oral prednisone  50 mg at 13, 7, and 1 hour prior to contrast administration. Adult: Diphenhydramine 50 mg oral, IM, or IV 1 hour prior to contrast administration.

## 2023-07-31 NOTE — Progress Notes (Signed)
 Assessment & Plan:  Routine physical examination Assessment & Plan: Congratulated patient on lifestyle changes and dedication to exercise. Screening labs ordered  Orders: -     CBC with Differential/Platelet -     Comprehensive metabolic panel with GFR -     PSA -     TSH -     Lipid panel -     VITAMIN D  25 Hydroxy (Vit-D Deficiency, Fractures)  Weight loss Assessment & Plan: Discussed reduction of sugar, portion size and increase in exercise No other B symptoms.  Patient and I discussed amount of weight loss and opted to pursue imaging to exclude malignancy.  ALLERGY TO CONTRAST ; documented rash.  Advised patient Oral prednisone  50 mg at 13, 7, and 1 hour prior to contrast administration. Adult: Diphenhydramine 50 mg oral, IM, or IV 1 hour prior to contrast administration.  Orders: -     CT CHEST ABDOMEN PELVIS W CONTRAST; Future -     predniSONE ; Take one tablet by mouth 14 hours prior, 7 hour prior and one hour prior to IV contrast administration  Dispense: 4 tablet; Refill: 0  Encounter for lipid screening for cardiovascular disease -     Lipid panel  Screening for prostate cancer -     PSA     Return precautions given.   Risks, benefits, and alternatives of the medications and treatment plan prescribed today were discussed, and patient expressed understanding.   Education regarding symptom management and diagnosis given to patient on AVS either electronically or printed.  Return in about 6 months (around 01/30/2024).  Bascom Bossier, FNP  Subjective:    Patient ID: Alan Collier, male    DOB: [birth year], 48 y.o.   MRN: [id number]  CC: Alan Collier is a 48 y.o. male who presents today for physical exam.    HPI: He has lost weight , approx 19 lbs over the last 18 mos. It has been gradual  He has started to use weight bearing exercise and endorse more garden work such as mowing yard.   He is eating less in portion sizes.   He has cut sugar from his  diet; he is not eating donuts, candy. Daily one 20 ounce diet soda.   He is feeling  better in regards to dietary changes     Denies CP, abdominal pain, chills, fever, rectal bleeding, change in stool pattern.    Colorectal  Cancer Screening: UTD 08/2021, repeat in 10 years  Prostate Cancer Screening: Has been discussed with patient and we agreed to order annually  Immunizations       Tetanus - UTD         Exercise: Gets regular exercise weight lifting , gardening.   Alcohol use:  occassional Smoking/tobacco use: Nonsmoker.    Health Maintenance  Topic Date Due   Flu Shot  09/13/2023   DTaP/Tdap/Td vaccine (2 - Td or Tdap) 07/09/2029   Colon Cancer Screening  09/12/2031   Hepatitis C Screening  Completed   HPV Vaccine  Aged Out   Meningitis B Vaccine  Aged Out   COVID-19 Vaccine  Discontinued   HIV Screening  Discontinued     ALLERGIES: Iodine and Shellfish allergy  Current Outpatient Medications on File Prior to Visit  Medication Sig Dispense Refill   amLODipine  (NORVASC ) 2.5 MG tablet Take 1 tablet (2.5 mg total) by mouth daily as needed. For BP 140/85. 30 tablet 1   cholecalciferol (VITAMIN D3) 25 MCG (1000 UNIT) tablet Take 2,000 Units  by mouth daily.     hydrocortisone  (ANUSOL -HC) 25 MG suppository Place 1 suppository (25 mg total) rectally 2 (two) times daily. 12 suppository 0   irbesartan  (AVAPRO ) 300 MG tablet TAKE 1 TABLET BY MOUTH DAILY 90 tablet 3   Lidocaine , Anorectal, 5 % CREA Apply thin layer to affected area every 6 hours as needed. 28 g 0   meloxicam  (MOBIC ) 15 MG tablet Take 1 tablet (15 mg total) by mouth daily. 30 tablet 1   mometasone  (ELOCON ) 0.1 % cream Apply to ear canal once daily as needed for itching 15 g 1   Omega-3 Fatty Acids (FISH OIL) 1000 MG CAPS Take 1 capsule by mouth daily.     omeprazole  (PRILOSEC ) 20 MG capsule Take 1 capsule (20 mg total) by mouth daily. 90 capsule 3   No current facility-administered medications on file prior to  visit.    Review of Systems  Constitutional:  Positive for unexpected weight change. Negative for chills, diaphoresis, fatigue and fever.  HENT:  Negative for congestion.   Respiratory:  Negative for cough.   Cardiovascular:  Negative for chest pain, palpitations and leg swelling.  Gastrointestinal:  Negative for abdominal pain, diarrhea, nausea and vomiting.  Genitourinary:  Negative for difficulty urinating and dysuria.  Musculoskeletal:  Negative for myalgias.  Skin:  Negative for rash.  Neurological:  Negative for headaches.  Hematological:  Negative for adenopathy.  Psychiatric/Behavioral:  Negative for confusion.       Objective:    BP 128/82   Pulse 80   Temp 98 F (36.7 C) (Oral)   Ht 5' 3 (1.6 m)   Wt 161 lb 3.2 oz (73.1 kg)   SpO2 98%   BMI 28.56 kg/m   BP Readings from Last 3 Encounters:  07/31/23 128/82  11/27/22 104/72  02/14/22 130/82   Wt Readings from Last 3 Encounters:  07/31/23 161 lb 3.2 oz (73.1 kg)  11/27/22 174 lb 12.8 oz (79.3 kg)  02/14/22 180 lb 9.6 oz (81.9 kg)    Physical Exam Vitals reviewed.  Constitutional:      Appearance: Normal appearance. He is well-developed.  Neck:     Thyroid : No thyroid  mass or thyromegaly.   Cardiovascular:     Rate and Rhythm: Regular rhythm.     Heart sounds: Normal heart sounds.  Pulmonary:     Effort: Pulmonary effort is normal. No respiratory distress.     Breath sounds: Normal breath sounds. No wheezing, rhonchi or rales.  Abdominal:     General: Bowel sounds are normal. There is no distension.     Palpations: Abdomen is soft. Abdomen is not rigid. There is no fluid wave or mass.     Tenderness: There is no abdominal tenderness. There is no guarding or rebound. Negative signs include Murphy's sign and McBurney's sign.  Lymphadenopathy:     Head:     Right side of head: No submental, submandibular, tonsillar, preauricular, posterior auricular or occipital adenopathy.     Left side of head: No  submental, submandibular, tonsillar, preauricular, posterior auricular or occipital adenopathy.     Cervical: No cervical adenopathy.   Skin:    General: Skin is warm and dry.   Neurological:     Mental Status: He is alert.   Psychiatric:        Speech: Speech normal.        Behavior: Behavior normal.

## 2023-07-31 NOTE — Patient Instructions (Addendum)
 To premedicate for IV contrast allergy:  Please take oral prednisone  50mg  Take one tablet by mouth 14 hours prior, 7 hour prior and one hour prior to IV contrast administration. I have sent to [facility].   Please take oral diphenhydramine ( Benadryl) 50mg  over the counter one hour prior to IV contrast administration.  I have ordered CT chest a/p.   Let us  know if you dont hear back within a week in regards to an appointment being scheduled.   So that you are aware, if you are [facility] MyChart user , please pay attention to your MyChart messages as you may receive a MyChart message with a phone number to call and schedule this test/appointment own your own from our referral coordinator. This is a new process so I do not want you to miss this message.  If you are not a MyChart user, you will receive a phone call.    Please let me know if weight loss persists.   Health Maintenance, Male Adopting a healthy lifestyle and getting preventive care are important in promoting health and wellness. Ask your health care provider about: The right schedule for you to have regular tests and exams. Things you can do on your own to prevent diseases and keep yourself healthy. What should I know about diet, weight, and exercise? Eat a healthy diet  Eat a diet that includes plenty of vegetables, fruits, low-fat dairy products, and lean protein. Do not eat a lot of foods that are high in solid fats, added sugars, or sodium. Maintain a healthy weight Body mass index (BMI) is a measurement that can be used to identify possible weight problems. It estimates body fat based on height and weight. Your health care provider can help determine your BMI and help you achieve or maintain a healthy weight. Get regular exercise Get regular exercise. This is one of the most important things you can do for your health. Most adults should: Exercise for at least 150 minutes each week. The exercise should increase your heart rate  and make you sweat (moderate-intensity exercise). Do strengthening exercises at least twice a week. This is in addition to the moderate-intensity exercise. Spend less time sitting. Even light physical activity can be beneficial. Watch cholesterol and blood lipids Have your blood tested for lipids and cholesterol at 47 years of age, then have this test every 5 years. You may need to have your cholesterol levels checked more often if: Your lipid or cholesterol levels are high. You are older than 47 years of age. You are at high risk for heart disease. What should I know about cancer screening? Many types of cancers can be detected early and may often be prevented. Depending on your health history and family history, you may need to have cancer screening at various ages. This may include screening for: Colorectal cancer. Prostate cancer. Skin cancer. Lung cancer. What should I know about heart disease, diabetes, and high blood pressure? Blood pressure and heart disease High blood pressure causes heart disease and increases the risk of stroke. This is more likely to develop in people who have high blood pressure readings or are overweight. Talk with your health care provider about your target blood pressure readings. Have your blood pressure checked: Every 3-5 years if you are 43-2 years of age. Every year if you are 50 years old or older. If you are between the ages of 43 and 42 and are a current or former smoker, ask your health care provider  if you should have a one-time screening for abdominal aortic aneurysm (AAA). Diabetes Have regular diabetes screenings. This checks your fasting blood sugar level. Have the screening done: Once every three years after age 26 if you are at a normal weight and have a low risk for diabetes. More often and at a younger age if you are overweight or have a high risk for diabetes. What should I know about preventing infection? Hepatitis B If you have a  higher risk for hepatitis B, you should be screened for this virus. Talk with your health care provider to find out if you are at risk for hepatitis B infection. Hepatitis C Blood testing is recommended for: Everyone born from [birth year] through 1964. Anyone with known risk factors for hepatitis C. Sexually transmitted infections (STIs) You should be screened each year for STIs, including gonorrhea and chlamydia, if: You are sexually active and are younger than 47 years of age. You are older than 47 years of age and your health care provider tells you that you are at risk for this type of infection. Your sexual activity has changed since you were last screened, and you are at increased risk for chlamydia or gonorrhea. Ask your health care provider if you are at risk. Ask your health care provider about whether you are at high risk for HIV. Your health care provider may recommend a prescription medicine to help prevent HIV infection. If you choose to take medicine to prevent HIV, you should first get tested for HIV. You should then be tested every 3 months for as long as you are taking the medicine. Follow these instructions at home: Alcohol use Do not drink alcohol if your health care provider tells you not to drink. If you drink alcohol: Limit how much you have to 0-2 drinks a day. Know how much alcohol is in your drink. In the [place] one drink equals one 12 oz bottle of beer (355 mL), one 5 oz glass of wine (148 mL), or one 1 oz glass of hard liquor (44 mL). Lifestyle Do not use any products that contain nicotine or tobacco. These products include cigarettes, chewing tobacco, and vaping devices, such as e-cigarettes. If you need help quitting, ask your health care provider. Do not use street drugs. Do not share needles. Ask your health care provider for help if you need support or information about quitting drugs. General instructions Schedule regular health, dental, and eye exams. Stay current  with your vaccines. Tell your health care provider if: You often feel depressed. You have ever been abused or do not feel safe at home. Summary Adopting a healthy lifestyle and getting preventive care are important in promoting health and wellness. Follow your health care provider's instructions about healthy diet, exercising, and getting tested or screened for diseases. Follow your health care provider's instructions on monitoring your cholesterol and blood pressure. This information is not intended to replace advice given to you by your health care provider. Make sure you discuss any questions you have with your health care provider. Document Revised: 06/20/2020 Document Reviewed: 06/20/2020 Elsevier Patient Education  2024 [employer].

## 2023-07-31 NOTE — Assessment & Plan Note (Signed)
 Congratulated patient on lifestyle changes and dedication to exercise. Screening labs ordered

## 2023-08-01 ENCOUNTER — Ambulatory Visit: Payer: Self-pay | Admitting: Family

## 2023-12-12 ENCOUNTER — Encounter: Admitting: Family

## 2024-01-30 ENCOUNTER — Ambulatory Visit: Admitting: Family

## 2024-01-30 ENCOUNTER — Ambulatory Visit: Payer: Self-pay | Admitting: Family

## 2024-01-30 VITALS — BP 124/80 | HR 65 | Temp 97.7°F | Ht 63.0 in | Wt 165.2 lb

## 2024-01-30 DIAGNOSIS — I10 Essential (primary) hypertension: Secondary | ICD-10-CM

## 2024-01-30 DIAGNOSIS — R634 Abnormal weight loss: Secondary | ICD-10-CM

## 2024-01-30 LAB — COMPREHENSIVE METABOLIC PANEL WITH GFR
ALT: 18 U/L (ref 3–53)
AST: 14 U/L (ref 5–37)
Albumin: 4.6 g/dL (ref 3.5–5.2)
Alkaline Phosphatase: 50 U/L (ref 39–117)
BUN: 15 mg/dL (ref 6–23)
CO2: 28 meq/L (ref 19–32)
Calcium: 9.1 mg/dL (ref 8.4–10.5)
Chloride: 101 meq/L (ref 96–112)
Creatinine, Ser: 0.92 mg/dL (ref 0.40–1.50)
GFR: 99.01 mL/min (ref >60.00)
Glucose, Bld: 100 mg/dL — ABNORMAL HIGH (ref 70–99)
Potassium: 3.9 meq/L (ref 3.5–5.1)
Sodium: 138 meq/L (ref 135–145)
Total Bilirubin: 0.7 mg/dL (ref 0.2–1.2)
Total Protein: 6.9 g/dL (ref 6.0–8.3)

## 2024-01-30 NOTE — Assessment & Plan Note (Signed)
 No B symptoms. No further weight loss. He declines further evaluation /imaging at this time.

## 2024-01-30 NOTE — Assessment & Plan Note (Addendum)
 Chronic, stable. Continue irbesartan  300mg  qam. Rare use of amlodipine  2.5mg  qd prn. Advised to monitor blood pressure more often and to let me know if dizziness becomes more persistent as may need to decrease irbesartan .

## 2024-01-30 NOTE — Progress Notes (Signed)
 A  Assessment & Plan:  Primary hypertension Assessment & Plan: Chronic, stable. Continue irbesartan  300mg  qam. Rare use of amlodipine  2.5mg  qd prn. Advised to monitor blood pressure more often and to let me know if dizziness becomes more persistent as may need to decrease irbesartan .   Orders: -     Comprehensive metabolic panel with GFR  Weight loss Assessment & Plan: No B symptoms. No further weight loss. He declines further evaluation /imaging at this time.       Return precautions given.   Risks, benefits, and alternatives of the medications and treatment plan prescribed today were discussed, and patient expressed understanding.   Education regarding symptom management and diagnosis given to patient on AVS either electronically or printed.  Return in about 6 months (around 07/30/2024) for Complete Physical Exam.  Rollene Northern, FNP  Subjective:    Patient ID: Alan Collier, male    DOB: 1975/11/24, 48 y.o.   MRN: [id number]  CC: Alan Collier is a 48 y.o. male who presents today for follow up.   HPI: Feels well today No new complaints  No further weight loss He has been tracking his calories  Denies fever, night sweats, abdominal pain  He has not used amlodipine  prn in a long time  Denies CP, syncope. Every once in a while brief dizziness when he stands quickly.   Colonoscopy is UTD   Allergies: Iodine and Shellfish allergy Medications Ordered Prior to Encounter[1]  Review of Systems  Constitutional:  Negative for chills and fever.  Respiratory:  Negative for cough.   Cardiovascular:  Negative for chest pain and palpitations.  Gastrointestinal:  Negative for nausea and vomiting.  Neurological:  Positive for dizziness. Negative for syncope and headaches.      Objective:    BP 124/80   Pulse 65   Temp 97.7 F (36.5 C) (Oral)   Ht 5' 3 (1.6 m)   Wt 165 lb 3.2 oz (74.9 kg)   SpO2 99%   BMI 29.26 kg/m  BP Readings from Last 3 Encounters:   01/30/24 124/80  07/31/23 128/82  11/27/22 104/72   Wt Readings from Last 3 Encounters:  01/30/24 165 lb 3.2 oz (74.9 kg)  07/31/23 161 lb 3.2 oz (73.1 kg)  11/27/22 174 lb 12.8 oz (79.3 kg)    Physical Exam Vitals reviewed.  Constitutional:      Appearance: He is well-developed.  Neck:     Vascular: No carotid bruit.  Cardiovascular:     Rate and Rhythm: Regular rhythm.     Heart sounds: Normal heart sounds.  Pulmonary:     Effort: Pulmonary effort is normal. No respiratory distress.     Breath sounds: Normal breath sounds. No wheezing, rhonchi or rales.  Lymphadenopathy:     Cervical: No cervical adenopathy.     Right cervical: No superficial cervical adenopathy.    Left cervical: No superficial cervical adenopathy.     Upper Body:     Right upper body: No supraclavicular or axillary adenopathy.     Left upper body: No supraclavicular or axillary adenopathy.  Skin:    General: Skin is warm and dry.  Neurological:     Mental Status: He is alert.  Psychiatric:        Speech: Speech normal.        Behavior: Behavior normal.           [1]  Current Outpatient Medications on File Prior to Visit  Medication Sig Dispense Refill   amLODipine  (  NORVASC ) 2.5 MG tablet Take 1 tablet (2.5 mg total) by mouth daily as needed. For BP 140/85. 30 tablet 1   cholecalciferol (VITAMIN D3) 25 MCG (1000 UNIT) tablet Take 2,000 Units by mouth daily.     hydrocortisone  (ANUSOL -HC) 25 MG suppository Place 1 suppository (25 mg total) rectally 2 (two) times daily. 12 suppository 0   irbesartan  (AVAPRO ) 300 MG tablet TAKE 1 TABLET BY MOUTH DAILY 90 tablet 3   Lidocaine , Anorectal, 5 % CREA Apply thin layer to affected area every 6 hours as needed. 28 g 0   meloxicam  (MOBIC ) 15 MG tablet Take 1 tablet (15 mg total) by mouth daily. 30 tablet 1   mometasone  (ELOCON ) 0.1 % cream Apply to ear canal once daily as needed for itching 15 g 1   Omega-3 Fatty Acids (FISH OIL) 1000 MG CAPS Take 1  capsule by mouth daily.     omeprazole  (PRILOSEC ) 20 MG capsule Take 1 capsule (20 mg total) by mouth daily. 90 capsule 3   No current facility-administered medications on file prior to visit.

## 2024-01-30 NOTE — Patient Instructions (Signed)
 It is imperative that you are seen AT least twice per year for labs and monitoring. Monitor blood pressure at home and me 5-6 reading on separate days. Goal is less than 120/80, based on newest guidelines, however we certainly want to be less than 130/80;  if persistently higher, please make sooner follow up appointment so we can recheck you blood pressure and manage/ adjust medications.  Monitor for dizziness as well as we discussed.   Careful with position changes.

## 2024-02-10 ENCOUNTER — Encounter: Admitting: Family

## 2024-08-07 ENCOUNTER — Encounter: Admitting: Family
